# Patient Record
Sex: Male | Born: 1950 | Race: Black or African American | Hispanic: No | Marital: Married | Smoking: Never smoker
Health system: Southern US, Community
[De-identification: ages and names within clinical notes are randomized; demographics above are authoritative.]

## PROBLEM LIST (undated history)

## (undated) DIAGNOSIS — E119 Type 2 diabetes mellitus without complications: Secondary | ICD-10-CM

## (undated) DIAGNOSIS — I10 Essential (primary) hypertension: Secondary | ICD-10-CM

## (undated) HISTORY — PX: COLONOSCOPY: SHX174

---

## 2006-10-22 ENCOUNTER — Ambulatory Visit: Payer: Self-pay | Admitting: Gastroenterology

## 2013-07-05 ENCOUNTER — Ambulatory Visit: Payer: Self-pay | Admitting: Gastroenterology

## 2016-12-18 ENCOUNTER — Emergency Department: Payer: Medicare Other

## 2016-12-18 ENCOUNTER — Encounter: Payer: Self-pay | Admitting: *Deleted

## 2016-12-18 ENCOUNTER — Emergency Department
Admission: EM | Admit: 2016-12-18 | Discharge: 2016-12-18 | Disposition: A | Discharge status: Home / Self Care | Payer: Medicare Other | Attending: Emergency Medicine | Admitting: Emergency Medicine

## 2016-12-18 DIAGNOSIS — R197 Diarrhea, unspecified: Secondary | ICD-10-CM | POA: Diagnosis not present

## 2016-12-18 DIAGNOSIS — R1013 Epigastric pain: Secondary | ICD-10-CM | POA: Diagnosis present

## 2016-12-18 DIAGNOSIS — Z79899 Other long term (current) drug therapy: Secondary | ICD-10-CM | POA: Diagnosis not present

## 2016-12-18 DIAGNOSIS — E119 Type 2 diabetes mellitus without complications: Secondary | ICD-10-CM | POA: Insufficient documentation

## 2016-12-18 DIAGNOSIS — Z7984 Long term (current) use of oral hypoglycemic drugs: Secondary | ICD-10-CM | POA: Insufficient documentation

## 2016-12-18 DIAGNOSIS — I10 Essential (primary) hypertension: Secondary | ICD-10-CM | POA: Diagnosis not present

## 2016-12-18 DIAGNOSIS — R1011 Right upper quadrant pain: Secondary | ICD-10-CM | POA: Insufficient documentation

## 2016-12-18 DIAGNOSIS — R112 Nausea with vomiting, unspecified: Secondary | ICD-10-CM | POA: Insufficient documentation

## 2016-12-18 HISTORY — DX: Essential (primary) hypertension: I10

## 2016-12-18 HISTORY — DX: Type 2 diabetes mellitus without complications: E11.9

## 2016-12-18 LAB — URINALYSIS, COMPLETE (UACMP) WITH MICROSCOPIC
BACTERIA UA: NONE SEEN
BILIRUBIN URINE: NEGATIVE
Glucose, UA: NEGATIVE mg/dL
Ketones, ur: 5 mg/dL — AB
LEUKOCYTES UA: NEGATIVE
Nitrite: NEGATIVE
Protein, ur: NEGATIVE mg/dL
SPECIFIC GRAVITY, URINE: 1.01 (ref 1.005–1.030)
pH: 5 (ref 5.0–8.0)

## 2016-12-18 LAB — COMPREHENSIVE METABOLIC PANEL
ALBUMIN: 4.3 g/dL (ref 3.5–5.0)
ALT: 15 U/L — ABNORMAL LOW (ref 17–63)
AST: 21 U/L (ref 15–41)
Alkaline Phosphatase: 72 U/L (ref 38–126)
Anion gap: 11 (ref 5–15)
BUN: 18 mg/dL (ref 6–20)
CHLORIDE: 98 mmol/L — AB (ref 101–111)
CO2: 29 mmol/L (ref 22–32)
Calcium: 9.6 mg/dL (ref 8.9–10.3)
Creatinine, Ser: 1.49 mg/dL — ABNORMAL HIGH (ref 0.61–1.24)
GFR calc Af Amer: 54 mL/min — ABNORMAL LOW (ref >60)
GFR, EST NON AFRICAN AMERICAN: 47 mL/min — AB (ref >60)
Glucose, Bld: 117 mg/dL — ABNORMAL HIGH (ref 65–99)
Potassium: 3.3 mmol/L — ABNORMAL LOW (ref 3.5–5.1)
Sodium: 138 mmol/L (ref 135–145)
Total Bilirubin: 0.7 mg/dL (ref 0.3–1.2)
Total Protein: 8 g/dL (ref 6.5–8.1)

## 2016-12-18 LAB — CBC
HEMATOCRIT: 39.9 % — AB (ref 40.0–52.0)
Hemoglobin: 13.2 g/dL (ref 13.0–18.0)
MCH: 26.1 pg (ref 26.0–34.0)
MCHC: 32.9 g/dL (ref 32.0–36.0)
MCV: 79.1 fL — AB (ref 80.0–100.0)
Platelets: 299 10*3/uL (ref 150–440)
RBC: 5.05 MIL/uL (ref 4.40–5.90)
RDW: 13.8 % (ref 11.5–14.5)
WBC: 7.6 10*3/uL (ref 3.8–10.6)

## 2016-12-18 LAB — TROPONIN I

## 2016-12-18 LAB — LIPASE, BLOOD: Lipase: 17 U/L (ref 11–51)

## 2016-12-18 MED ORDER — POTASSIUM CHLORIDE CRYS ER 20 MEQ PO TBCR
40.0000 meq | EXTENDED_RELEASE_TABLET | Freq: Once | ORAL | Status: AC
  Administered 2016-12-18: 40 meq via ORAL
  Filled 2016-12-18: qty 2

## 2016-12-18 MED ORDER — SODIUM CHLORIDE 0.9 % IV BOLUS (SEPSIS)
1000.0000 mL | Freq: Once | INTRAVENOUS | Status: AC
  Administered 2016-12-18: 1000 mL via INTRAVENOUS

## 2016-12-18 MED ORDER — ONDANSETRON HCL 4 MG/2ML IJ SOLN
4.0000 mg | Freq: Once | INTRAMUSCULAR | Status: AC
  Administered 2016-12-18: 4 mg via INTRAVENOUS
  Filled 2016-12-18: qty 2

## 2016-12-18 MED ORDER — GI COCKTAIL ~~LOC~~
30.0000 mL | Freq: Once | ORAL | Status: AC
Start: 2016-12-18 — End: 2016-12-18
  Administered 2016-12-18: 30 mL via ORAL
  Filled 2016-12-18: qty 30

## 2016-12-18 MED ORDER — ONDANSETRON 4 MG PO TBDP: ORAL_TABLET | ORAL | 0 refills | Status: DC

## 2016-12-18 MED ORDER — POTASSIUM CHLORIDE CRYS ER 20 MEQ PO TBCR
EXTENDED_RELEASE_TABLET | ORAL | Status: AC
  Filled 2016-12-18: qty 1

## 2016-12-18 NOTE — ED Provider Notes (Signed)
[facility]-EMERGENCY DEPARTMENT Provider Note   CSN: [id number] Arrival date & time: 12/18/16  1717     History   Chief Complaint Chief Complaint  Patient presents with  . Abdominal Pain  . Emesis    HPI Marc Daniel is a 66 y.o. male hx of DM, Hypertension here presenting with epigastric pain, vomiting. Patient states that he has been having epigastric pain and right upper quadrant pain after he eats for the last 3 days. States that he occasionally has some vomiting after he eats. He also has some loose stools of diarrhea as well. Denies any fevers or chills. Patient has no previous abdominal surgeries and has no chest pain.  The history is provided by the patient.    Past Medical History:  Diagnosis Date  . Diabetes mellitus without complication (HCC)   . Hypertension     There are no active problems to display for this patient.   No past surgical history on file.     Home Medications    Prior to Admission medications   Medication Sig Start Date End Date Taking? Authorizing Provider  glipiZIDE (GLUCOTROL) 5 MG tablet Take 5 mg by mouth 2 (two) times daily. 10/22/16  Yes Historical Provider, MD  lisinopril-hydrochlorothiazide (PRINZIDE,ZESTORETIC) 20-25 MG tablet Take 1 tablet by mouth daily. 11/18/16  Yes Historical Provider, MD  lovastatin (MEVACOR) 20 MG tablet Take 20 mg by mouth daily. 11/18/16  Yes Historical Provider, MD  metFORMIN (GLUCOPHAGE) 500 MG tablet Take 1,000 mg by mouth 2 (two) times daily. 10/18/16  Yes Historical Provider, MD  triamcinolone cream (KENALOG) 0.1 % Apply 1 application topically 2 (two) times daily. 11/26/16  Yes Historical Provider, MD    Family History No family history on file.  Social History Social History  Substance Use Topics  . Smoking status: Never Smoker  . Smokeless tobacco: Not on file  . Alcohol use No     Allergies   Sulfa antibiotics   Review of Systems Review of Systems  Gastrointestinal: Positive for  abdominal pain and vomiting.  All other systems reviewed and are negative.    Physical Exam Updated Vital Signs BP 134/86   Pulse 77   Temp 98.7 F (37.1 C) (Oral)   Resp 18   Ht  (1.651 m)   Wt 159 lb (72.1 kg)   SpO2 97%   BMI 26.46 kg/m   Physical Exam  Constitutional: He is oriented to person, place, and time. He appears well-nourished.  Slightly dehydrated   HENT:  Head: Normocephalic.  MM slightly dry   Eyes: EOM are normal. Pupils are equal, round, and reactive to light.  Neck: Normal range of motion. Neck supple.  Cardiovascular: Normal rate, regular rhythm and normal heart sounds.   Pulmonary/Chest: Effort normal and breath sounds normal. No respiratory distress. He has no wheezes. He has no rales.  Abdominal: Soft. Bowel sounds are normal.  Mild epigastric and RUQ tenderness, no murphy. No CVAT. No lower abdominal tenderness   Musculoskeletal: Normal range of motion.  Neurological: He is alert and oriented to person, place, and time. No cranial nerve deficit.  Skin: Skin is warm.  Psychiatric: He has a normal mood and affect.  Nursing note and vitals reviewed.    ED Treatments / Results  Labs (all labs ordered are listed, but only abnormal results are displayed) Labs Reviewed  COMPREHENSIVE METABOLIC PANEL - Abnormal; Notable for the following:       Result Value   Potassium 3.3 (*)  Chloride 98 (*)    Glucose, Bld 117 (*)    Creatinine, Ser 1.49 (*)    ALT 15 (*)    GFR calc non Af Amer 47 (*)    GFR calc Af Amer 54 (*)    All other components within normal limits  CBC - Abnormal; Notable for the following:    HCT 39.9 (*)    MCV 79.1 (*)    All other components within normal limits  URINALYSIS, COMPLETE (UACMP) WITH MICROSCOPIC - Abnormal; Notable for the following:    Color, Urine YELLOW (*)    APPearance CLEAR (*)    Hgb urine dipstick SMALL (*)    Ketones, ur 5 (*)    Squamous Epithelial / LPF 0-5 (*)    All other components within  normal limits  LIPASE, BLOOD  TROPONIN I    EKG  EKG Interpretation  Date/Time:  Wednesday December 18 2016 17:39:51 EDT Ventricular Rate:  96 PR Interval:  128 QRS Duration: 74 QT Interval:  338 QTC Calculation: 427 R Axis:   164 Text Interpretation:  Sinus rhythm with marked sinus arrhythmia Lateral infarct , age undetermined T wave abnormality, consider inferior ischemia Abnormal ECG No previous ECGs available Reconfirmed by YAO  MD, DAVID ([id number]) on 12/18/2016 11:27:39 PM       Radiology US Abdomen Limited Ruq  Result Date: 12/18/2016 CLINICAL DATA:  Right upper quadrant pain for 2 days EXAM: US ABDOMEN LIMITED - RIGHT UPPER QUADRANT COMPARISON:  None. FINDINGS: Gallbladder: No gallstones or wall thickening visualized. No sonographic Murphy sign noted by sonographer. Common bile duct: Diameter: 4 mm. Liver: No focal lesion identified. Within normal limits in parenchymal echogenicity. Incidental note is made of multiple right renal cysts. The largest of these measures 3.7 cm. IMPRESSION: Right renal cyst.  No other focal abnormality is noted. Electronically Signed   By: Alcide Clever M.D.   On: 12/18/2016 21:21    Procedures Procedures (including critical care time)  Medications Ordered in ED Medications  sodium chloride 0.9 % bolus 1,000 mL (1,000 mLs Intravenous New Bag/Given 12/18/16 2035)  ondansetron [facility]) injection 4 mg (4 mg Intravenous Given 12/18/16 2035)  potassium chloride SA (K-DUR,KLOR-CON) CR tablet 40 mEq (40 mEq Oral Given 12/18/16 2203)  gi cocktail (Maalox,Lidocaine,Donnatal) (30 mLs Oral Given 12/18/16 2154)     Initial Impression / Assessment and Plan / ED Course  I have reviewed the triage vital signs and the nursing notes.  Pertinent labs & imaging results that were available during my care of the patient were reviewed by me and considered in my medical decision making (see chart for details).     Marc Daniel is a 67 y.o. male here with epigastric  pain, vomiting, diarrhea. Likely gastro. Consider cholelithiasis vs acute chole as well. Will get labs, lipase, RUQ [place]. Will hydrate and reassess.   11:27 PM UA unremarkable. Labs showed K 3.3, supplemented. [place] RUQ unremarkable. Tolerated PO in the ED. Will dc home with zofran prn. Likely viral gastro.   Final Clinical Impressions(s) / ED Diagnoses   Final diagnoses:  RUQ pain    New Prescriptions New Prescriptions   No medications on file     Charlynne Pander, MD 12/18/16 2327

## 2016-12-18 NOTE — ED Triage Notes (Signed)
Pt reports vomiting after eating for the last two days with abdominal pain

## 2016-12-18 NOTE — Discharge Instructions (Signed)
Stay hydrated.   Take zofran as needed for nausea.   See your doctor  Return to ER if you have worse vomiting, severe abdominal pain, dehydration, fever.

## 2016-12-18 NOTE — ED Notes (Signed)
Specimen cup was given to collect when able.

## 2017-09-24 ENCOUNTER — Encounter: Payer: Self-pay | Admitting: *Deleted

## 2017-09-24 ENCOUNTER — Other Ambulatory Visit: Payer: Self-pay

## 2017-09-26 NOTE — Discharge Instructions (Signed)

## 2017-09-29 ENCOUNTER — Encounter
Admission: RE | Disposition: A | Discharge status: Home / Self Care | Payer: Self-pay | Source: Ambulatory Visit | Attending: Ophthalmology

## 2017-09-29 ENCOUNTER — Ambulatory Visit
Admission: RE | Admit: 2017-09-29 | Discharge: 2017-09-29 | Disposition: A | Discharge status: Home / Self Care | Payer: BLUE CROSS/BLUE SHIELD | Source: Ambulatory Visit | Attending: Ophthalmology | Admitting: Ophthalmology

## 2017-09-29 ENCOUNTER — Ambulatory Visit: Payer: BLUE CROSS/BLUE SHIELD | Admitting: Anesthesiology

## 2017-09-29 DIAGNOSIS — Z79899 Other long term (current) drug therapy: Secondary | ICD-10-CM | POA: Diagnosis not present

## 2017-09-29 DIAGNOSIS — I10 Essential (primary) hypertension: Secondary | ICD-10-CM | POA: Insufficient documentation

## 2017-09-29 DIAGNOSIS — E119 Type 2 diabetes mellitus without complications: Secondary | ICD-10-CM | POA: Diagnosis not present

## 2017-09-29 DIAGNOSIS — E7800 Pure hypercholesterolemia, unspecified: Secondary | ICD-10-CM | POA: Diagnosis not present

## 2017-09-29 DIAGNOSIS — H2511 Age-related nuclear cataract, right eye: Secondary | ICD-10-CM | POA: Insufficient documentation

## 2017-09-29 DIAGNOSIS — Z7984 Long term (current) use of oral hypoglycemic drugs: Secondary | ICD-10-CM | POA: Insufficient documentation

## 2017-09-29 HISTORY — PX: CATARACT EXTRACTION W/PHACO: SHX586

## 2017-09-29 LAB — GLUCOSE, CAPILLARY
GLUCOSE-CAPILLARY: 114 mg/dL — AB (ref 65–99)
Glucose-Capillary: 137 mg/dL — ABNORMAL HIGH (ref 65–99)

## 2017-09-29 SURGERY — PHACOEMULSIFICATION, CATARACT, WITH IOL INSERTION
Anesthesia: Monitor Anesthesia Care | Site: Eye | Laterality: Right | Wound class: Clean

## 2017-09-29 MED ORDER — MIDAZOLAM HCL 2 MG/2ML IJ SOLN
INTRAMUSCULAR | Status: DC | PRN
  Administered 2017-09-29: 2 mg via INTRAVENOUS

## 2017-09-29 MED ORDER — NA HYALUR & NA CHOND-NA HYALUR 0.4-0.35 ML IO KIT
PACK | INTRAOCULAR | Status: DC | PRN
  Administered 2017-09-29: 1 mL via INTRAOCULAR

## 2017-09-29 MED ORDER — FENTANYL CITRATE (PF) 100 MCG/2ML IJ SOLN
INTRAMUSCULAR | Status: DC | PRN
  Administered 2017-09-29: 50 ug via INTRAVENOUS

## 2017-09-29 MED ORDER — EPINEPHRINE PF 1 MG/ML IJ SOLN
INTRAOCULAR | Status: DC | PRN
  Administered 2017-09-29: 64 mL via OPHTHALMIC

## 2017-09-29 MED ORDER — BRIMONIDINE TARTRATE-TIMOLOL 0.2-0.5 % OP SOLN
OPHTHALMIC | Status: DC | PRN
  Administered 2017-09-29: 1 [drp] via OPHTHALMIC

## 2017-09-29 MED ORDER — ARMC OPHTHALMIC DILATING DROPS
1.0000 "application " | OPHTHALMIC | Status: DC | PRN
  Administered 2017-09-29 (×3): 1 via OPHTHALMIC

## 2017-09-29 MED ORDER — CEFUROXIME OPHTHALMIC INJECTION 1 MG/0.1 ML
INJECTION | OPHTHALMIC | Status: DC | PRN
  Administered 2017-09-29: 0.1 mL via INTRACAMERAL

## 2017-09-29 MED ORDER — MOXIFLOXACIN HCL 0.5 % OP SOLN
1.0000 [drp] | OPHTHALMIC | Status: DC | PRN
  Administered 2017-09-29 (×3): 1 [drp] via OPHTHALMIC

## 2017-09-29 MED ORDER — BALANCED SALT IO SOLN
INTRAOCULAR | Status: DC | PRN
  Administered 2017-09-29: 1 mL

## 2017-09-29 SURGICAL SUPPLY — 18 items
CANNULA ANT/CHMB 27GA (MISCELLANEOUS) ×3 IMPLANT
GLOVE SURG LX 7.5 STRW (GLOVE) ×2
GLOVE SURG LX STRL 7.5 STRW (GLOVE) ×1 IMPLANT
GLOVE SURG TRIUMPH 8.0 PF LTX (GLOVE) ×3 IMPLANT
GOWN STRL REUS W/ TWL LRG LVL3 (GOWN DISPOSABLE) ×2 IMPLANT
GOWN STRL REUS W/TWL LRG LVL3 (GOWN DISPOSABLE) ×4
LENS IOL TECNIS ITEC 20.0 (Intraocular Lens) ×3 IMPLANT
MARKER SKIN DUAL TIP RULER LAB (MISCELLANEOUS) ×3 IMPLANT
NEEDLE FILTER BLUNT 18X 1/2SAF (NEEDLE) ×2
NEEDLE FILTER BLUNT 18X1 1/2 (NEEDLE) ×1 IMPLANT
PACK CATARACT BRASINGTON (MISCELLANEOUS) ×3 IMPLANT
PACK EYE AFTER SURG (MISCELLANEOUS) ×3 IMPLANT
PACK OPTHALMIC (MISCELLANEOUS) ×3 IMPLANT
SYR 3ML LL SCALE MARK (SYRINGE) ×3 IMPLANT
SYR 5ML LL (SYRINGE) ×3 IMPLANT
SYR TB 1ML LUER SLIP (SYRINGE) ×3 IMPLANT
WATER STERILE IRR 500ML POUR (IV SOLUTION) ×3 IMPLANT
WIPE NON LINTING 3.25X3.25 (MISCELLANEOUS) ×3 IMPLANT

## 2017-09-29 NOTE — Op Note (Signed)
LOCATION:  [facility]   PREOPERATIVE DIAGNOSIS:    Nuclear sclerotic cataract right eye. H25.11   POSTOPERATIVE DIAGNOSIS:  Nuclear sclerotic cataract right eye.     PROCEDURE:  Phacoemusification with posterior chamber intraocular lens placement of the right eye   LENS:   Implant Name Type Inv. Item Serial No. Manufacturer Lot No. LRB No. Used  LENS IOL DIOP 20.0 - [id number] Intraocular Lens LENS IOL DIOP 20.0 [id number] AMO  Right 1        ULTRASOUND TIME: 12 % of 1 minutes, 16 seconds.  CDE 8.9   SURGEON:  Deirdre Evenerhadwick R. Leslie Langille, MD   ANESTHESIA:  Topical with tetracaine drops and 2% Xylocaine jelly, augmented with 1% preservative-free intracameral lidocaine.    COMPLICATIONS:  None.   DESCRIPTION OF PROCEDURE:  The patient was identified in the holding room and transported to the operating room and placed in the supine position under the operating microscope.  The right eye was identified as the operative eye and it was prepped and draped in the usual sterile ophthalmic fashion.   A 1 millimeter clear-corneal paracentesis was made at the 12:00 position.  0.5 ml of preservative-free 1% lidocaine was injected into the anterior chamber. The anterior chamber was filled with Viscoat viscoelastic.  A 2.4 millimeter keratome was used to make a near-clear corneal incision at the 9:00 position.  A curvilinear capsulorrhexis was made with a cystotome and capsulorrhexis forceps.  Balanced salt solution was used to hydrodissect and hydrodelineate the nucleus.   Phacoemulsification was then used in stop and chop fashion to remove the lens nucleus and epinucleus.  The remaining cortex was then removed using the irrigation and aspiration handpiece. Provisc was then placed into the capsular bag to distend it for lens placement.  A lens was then injected into the capsular bag.  The remaining viscoelastic was aspirated.   Wounds were hydrated with balanced salt solution.  The anterior  chamber was inflated to a physiologic pressure with balanced salt solution.  No wound leaks were noted. Cefuroxime 0.1 ml of a 10mg /ml solution was injected into the anterior chamber for a dose of 1 mg of intracameral antibiotic at the completion of the case.   Timolol and Brimonidine drops were applied to the eye.  The patient was taken to the recovery room in stable condition without complications of anesthesia or surgery.   Thandiwe Siragusa 09/29/2017, 8:07 AM

## 2017-09-29 NOTE — Transfer of Care (Signed)
Immediate Anesthesia Transfer of Care Note  Patient: Marc Daniel  Procedure(s) Performed: CATARACT EXTRACTION PHACO AND INTRAOCULAR LENS PLACEMENT (IOC) RIGHT DIABETIC (Right Eye)  Patient Location: PACU  Anesthesia Type: MAC  Level of Consciousness: awake, alert  and patient cooperative  Airway and Oxygen Therapy: Patient Spontanous Breathing and Patient connected to supplemental oxygen  Post-op Assessment: Post-op Vital signs reviewed, Patient's Cardiovascular Status Stable, Respiratory Function Stable, Patent Airway and No signs of Nausea or vomiting  Post-op Vital Signs: Reviewed and stable  Complications: No apparent anesthesia complications

## 2017-09-29 NOTE — Anesthesia Postprocedure Evaluation (Signed)
Anesthesia Post Note  Patient: Marc Daniel  Procedure(s) Performed: CATARACT EXTRACTION PHACO AND INTRAOCULAR LENS PLACEMENT (IOC) RIGHT DIABETIC (Right Eye)  Patient location during evaluation: PACU Anesthesia Type: MAC Level of consciousness: awake Pain management: pain level controlled Vital Signs Assessment: post-procedure vital signs reviewed and stable Respiratory status: spontaneous breathing Cardiovascular status: blood pressure returned to baseline Postop Assessment: no headache Anesthetic complications: no    Lavonna Monarch

## 2017-09-29 NOTE — Anesthesia Preprocedure Evaluation (Addendum)
Anesthesia Evaluation  Patient identified by MRN, date of birth, ID band Patient awake    Reviewed: Allergy & Precautions, NPO status , Patient's Chart, lab work & pertinent test results, reviewed documented beta blocker date and time   Airway Mallampati: II  TM Distance: >3 FB Neck ROM: Full    Dental no notable dental hx.    Pulmonary neg pulmonary ROS,    Pulmonary exam normal breath sounds clear to auscultation- rhonchi       Cardiovascular hypertension, Normal cardiovascular exam Rhythm:Regular     Neuro/Psych negative neurological ROS  negative psych ROS   GI/Hepatic negative GI ROS, Neg liver ROS,   Endo/Other  diabetes  Renal/GU negative Renal ROS  negative genitourinary   Musculoskeletal negative musculoskeletal ROS (+)   Abdominal Normal abdominal exam  (+)   Peds  Hematology   Anesthesia Other Findings   Reproductive/Obstetrics                            Anesthesia Physical Anesthesia Plan  ASA: II  Anesthesia Plan: MAC   Post-op Pain Management:    Induction: Intravenous  PONV Risk Score and Plan:   Airway Management Planned: Natural Airway  Additional Equipment: None  Intra-op Plan:   Post-operative Plan:   Informed Consent: I have reviewed the patients History and Physical, chart, labs and discussed the procedure including the risks, benefits and alternatives for the proposed anesthesia with the patient or authorized representative who has indicated his/her understanding and acceptance.     Plan Discussed with: CRNA, Anesthesiologist and Surgeon  Anesthesia Plan Comments:         Anesthesia Quick Evaluation

## 2017-09-29 NOTE — Anesthesia Procedure Notes (Signed)
Procedure Name: MAC Date/Time: 09/29/2017 7:50 AM Performed by: Janna Arch, CRNA Pre-anesthesia Checklist: Patient identified, Emergency Drugs available, Suction available and Patient being monitored Patient Re-evaluated:Patient Re-evaluated prior to induction Oxygen Delivery Method: Nasal cannula

## 2017-09-29 NOTE — H&P (Signed)
The History and Physical notes are on paper, have been signed, and are to be scanned. The patient remains stable and unchanged from the H&P.   Previous H&P reviewed, patient examined, and there are no changes.  Tara Rud 09/29/2017 7:41 AM

## 2017-10-16 ENCOUNTER — Other Ambulatory Visit: Payer: Self-pay

## 2017-10-16 ENCOUNTER — Encounter: Payer: Self-pay | Admitting: *Deleted

## 2017-10-20 NOTE — Discharge Instructions (Signed)

## 2017-10-22 ENCOUNTER — Encounter
Admission: RE | Disposition: A | Discharge status: Home / Self Care | Payer: Self-pay | Source: Ambulatory Visit | Attending: Ophthalmology

## 2017-10-22 ENCOUNTER — Ambulatory Visit: Payer: BLUE CROSS/BLUE SHIELD | Admitting: Anesthesiology

## 2017-10-22 ENCOUNTER — Ambulatory Visit
Admission: RE | Admit: 2017-10-22 | Discharge: 2017-10-22 | Disposition: A | Discharge status: Home / Self Care | Payer: BLUE CROSS/BLUE SHIELD | Source: Ambulatory Visit | Attending: Ophthalmology | Admitting: Ophthalmology

## 2017-10-22 DIAGNOSIS — E119 Type 2 diabetes mellitus without complications: Secondary | ICD-10-CM | POA: Diagnosis not present

## 2017-10-22 DIAGNOSIS — H2512 Age-related nuclear cataract, left eye: Secondary | ICD-10-CM | POA: Insufficient documentation

## 2017-10-22 DIAGNOSIS — I10 Essential (primary) hypertension: Secondary | ICD-10-CM | POA: Diagnosis not present

## 2017-10-22 DIAGNOSIS — Z7984 Long term (current) use of oral hypoglycemic drugs: Secondary | ICD-10-CM | POA: Insufficient documentation

## 2017-10-22 DIAGNOSIS — Z79899 Other long term (current) drug therapy: Secondary | ICD-10-CM | POA: Insufficient documentation

## 2017-10-22 DIAGNOSIS — E7800 Pure hypercholesterolemia, unspecified: Secondary | ICD-10-CM | POA: Insufficient documentation

## 2017-10-22 HISTORY — PX: CATARACT EXTRACTION W/PHACO: SHX586

## 2017-10-22 LAB — GLUCOSE, CAPILLARY: Glucose-Capillary: 143 mg/dL — ABNORMAL HIGH (ref 65–99)

## 2017-10-22 SURGERY — PHACOEMULSIFICATION, CATARACT, WITH IOL INSERTION
Anesthesia: Monitor Anesthesia Care | Site: Eye | Laterality: Left | Wound class: Clean

## 2017-10-22 MED ORDER — LACTATED RINGERS IV SOLN: INTRAVENOUS | Status: DC

## 2017-10-22 MED ORDER — EPINEPHRINE PF 1 MG/ML IJ SOLN
INTRAOCULAR | Status: DC | PRN
  Administered 2017-10-22: 57 mL via OPHTHALMIC

## 2017-10-22 MED ORDER — ONDANSETRON HCL 4 MG/2ML IJ SOLN: 4.0000 mg | Freq: Once | INTRAMUSCULAR | Status: DC | PRN

## 2017-10-22 MED ORDER — ACETAMINOPHEN 160 MG/5ML PO SOLN: 325.0000 mg | ORAL | Status: DC | PRN

## 2017-10-22 MED ORDER — BRIMONIDINE TARTRATE-TIMOLOL 0.2-0.5 % OP SOLN
OPHTHALMIC | Status: DC | PRN
  Administered 2017-10-22: 1 [drp] via OPHTHALMIC

## 2017-10-22 MED ORDER — ARMC OPHTHALMIC DILATING DROPS
1.0000 "application " | OPHTHALMIC | Status: DC | PRN
  Administered 2017-10-22 (×3): 1 via OPHTHALMIC

## 2017-10-22 MED ORDER — MIDAZOLAM HCL 2 MG/2ML IJ SOLN
INTRAMUSCULAR | Status: DC | PRN
  Administered 2017-10-22: 1 mg via INTRAVENOUS

## 2017-10-22 MED ORDER — LIDOCAINE HCL (PF) 2 % IJ SOLN
INTRAOCULAR | Status: DC | PRN
  Administered 2017-10-22: 1 mL via INTRAMUSCULAR

## 2017-10-22 MED ORDER — NA HYALUR & NA CHOND-NA HYALUR 0.4-0.35 ML IO KIT
PACK | INTRAOCULAR | Status: DC | PRN
  Administered 2017-10-22: 1 mL via INTRAOCULAR

## 2017-10-22 MED ORDER — CEFUROXIME OPHTHALMIC INJECTION 1 MG/0.1 ML
INJECTION | OPHTHALMIC | Status: DC | PRN
  Administered 2017-10-22: .3 mL via OPHTHALMIC

## 2017-10-22 MED ORDER — MOXIFLOXACIN HCL 0.5 % OP SOLN
1.0000 [drp] | OPHTHALMIC | Status: DC | PRN
  Administered 2017-10-22 (×3): 1 [drp] via OPHTHALMIC

## 2017-10-22 MED ORDER — FENTANYL CITRATE (PF) 100 MCG/2ML IJ SOLN
INTRAMUSCULAR | Status: DC | PRN
  Administered 2017-10-22: 50 ug via INTRAVENOUS

## 2017-10-22 MED ORDER — ACETAMINOPHEN 325 MG PO TABS: 650.0000 mg | ORAL_TABLET | Freq: Once | ORAL | Status: DC | PRN

## 2017-10-22 SURGICAL SUPPLY — 25 items
CANNULA ANT/CHMB 27GA (MISCELLANEOUS) ×3 IMPLANT
CARTRIDGE ABBOTT (MISCELLANEOUS) IMPLANT
GLOVE SURG LX 7.5 STRW (GLOVE) ×2
GLOVE SURG LX STRL 7.5 STRW (GLOVE) ×1 IMPLANT
GLOVE SURG TRIUMPH 8.0 PF LTX (GLOVE) ×3 IMPLANT
GOWN STRL REUS W/ TWL LRG LVL3 (GOWN DISPOSABLE) ×2 IMPLANT
GOWN STRL REUS W/TWL LRG LVL3 (GOWN DISPOSABLE) ×4
LENS IOL TECNIS ITEC 20.5 (Intraocular Lens) ×3 IMPLANT
MARKER SKIN DUAL TIP RULER LAB (MISCELLANEOUS) ×3 IMPLANT
NDL RETROBULBAR .5 NSTRL (NEEDLE) IMPLANT
NEEDLE FILTER BLUNT 18X 1/2SAF (NEEDLE) ×2
NEEDLE FILTER BLUNT 18X1 1/2 (NEEDLE) ×1 IMPLANT
PACK CATARACT BRASINGTON (MISCELLANEOUS) ×3 IMPLANT
PACK EYE AFTER SURG (MISCELLANEOUS) ×3 IMPLANT
PACK OPTHALMIC (MISCELLANEOUS) ×3 IMPLANT
RING MALYGIN 7.0 (MISCELLANEOUS) IMPLANT
SUT ETHILON 10-0 CS-B-6CS-B-6 (SUTURE)
SUT VICRYL  9 0 (SUTURE)
SUT VICRYL 9 0 (SUTURE) IMPLANT
SUTURE EHLN 10-0 CS-B-6CS-B-6 (SUTURE) IMPLANT
SYR 3ML LL SCALE MARK (SYRINGE) ×3 IMPLANT
SYR 5ML LL (SYRINGE) ×3 IMPLANT
SYR TB 1ML LUER SLIP (SYRINGE) ×3 IMPLANT
WATER STERILE IRR 500ML POUR (IV SOLUTION) ×3 IMPLANT
WIPE NON LINTING 3.25X3.25 (MISCELLANEOUS) ×3 IMPLANT

## 2017-10-22 NOTE — Transfer of Care (Signed)
Immediate Anesthesia Transfer of Care Note  Patient: Marc Daniel  Procedure(s) Performed: CATARACT EXTRACTION PHACO AND INTRAOCULAR LENS PLACEMENT (IOC) LEFT DIABETIC (Left Eye)  Patient Location: PACU  Anesthesia Type: MAC  Level of Consciousness: awake, alert  and patient cooperative  Airway and Oxygen Therapy: Patient Spontanous Breathing and Patient connected to supplemental oxygen  Post-op Assessment: Post-op Vital signs reviewed, Patient's Cardiovascular Status Stable, Respiratory Function Stable, Patent Airway and No signs of Nausea or vomiting  Post-op Vital Signs: Reviewed and stable  Complications: No apparent anesthesia complications

## 2017-10-22 NOTE — H&P (Signed)
The History and Physical notes are on paper, have been signed, and are to be scanned. The patient remains stable and unchanged from the H&P.   Previous H&P reviewed, patient examined, and there are no changes.  Marc Daniel 10/22/2017 9:03 AM   

## 2017-10-22 NOTE — Anesthesia Postprocedure Evaluation (Signed)
Anesthesia Post Note  Patient: Marc Daniel  Procedure(s) Performed: CATARACT EXTRACTION PHACO AND INTRAOCULAR LENS PLACEMENT (IOC) LEFT DIABETIC (Left Eye)  Patient location during evaluation: PACU Anesthesia Type: MAC Level of consciousness: awake and alert, oriented and patient cooperative Pain management: pain level controlled Vital Signs Assessment: post-procedure vital signs reviewed and stable Respiratory status: spontaneous breathing, nonlabored ventilation and respiratory function stable Cardiovascular status: blood pressure returned to baseline and stable Postop Assessment: adequate PO intake Anesthetic complications: no    Darrin Nipper

## 2017-10-22 NOTE — Anesthesia Procedure Notes (Signed)
Procedure Name: MAC Performed by: Cheryn Lundquist, CRNA Pre-anesthesia Checklist: Patient identified, Emergency Drugs available, Suction available, Patient being monitored and Timeout performed Patient Re-evaluated:Patient Re-evaluated prior to induction Oxygen Delivery Method: Nasal cannula       

## 2017-10-22 NOTE — Anesthesia Preprocedure Evaluation (Signed)
Anesthesia Evaluation  Patient identified by MRN, date of birth, ID band Patient awake    Reviewed: Allergy & Precautions, NPO status , Patient's Chart, lab work & pertinent test results  History of Anesthesia Complications Negative for: history of anesthetic complications  Airway Mallampati: II  TM Distance: >3 FB Neck ROM: Full    Dental no notable dental hx.    Pulmonary neg pulmonary ROS,    Pulmonary exam normal breath sounds clear to auscultation       Cardiovascular Exercise Tolerance: Good hypertension, Normal cardiovascular exam Rhythm:Regular Rate:Normal     Neuro/Psych negative neurological ROS     GI/Hepatic negative GI ROS,   Endo/Other  diabetes, Type 2  Renal/GU negative Renal ROS     Musculoskeletal   Abdominal   Peds  Hematology negative hematology ROS (+)   Anesthesia Other Findings   Reproductive/Obstetrics                             Anesthesia Physical Anesthesia Plan  ASA: II  Anesthesia Plan: MAC   Post-op Pain Management:    Induction: Intravenous  PONV Risk Score and Plan: 1 and TIVA and Midazolam  Airway Management Planned: Natural Airway  Additional Equipment:   Intra-op Plan:   Post-operative Plan:   Informed Consent: I have reviewed the patients History and Physical, chart, labs and discussed the procedure including the risks, benefits and alternatives for the proposed anesthesia with the patient or authorized representative who has indicated his/her understanding and acceptance.     Plan Discussed with: CRNA  Anesthesia Plan Comments:         Anesthesia Quick Evaluation

## 2017-10-22 NOTE — Op Note (Signed)
OPERATIVE NOTE  Michelle NasutiDonnell M Nephew [id number] 10/22/2017   PREOPERATIVE DIAGNOSIS:  Nuclear sclerotic cataract left eye. H25.12   POSTOPERATIVE DIAGNOSIS:    Nuclear sclerotic cataract left eye.     PROCEDURE:  Phacoemusification with posterior chamber intraocular lens placement of the left eye   LENS:   Implant Name Type Inv. Item Serial No. Manufacturer Lot No. LRB No. Used  LENS IOL DIOP 20.5 - [id number] Intraocular Lens LENS IOL DIOP 20.5 [id number] AMO  Left 1        ULTRASOUND TIME: 12  % of 1 minutes 7 seconds, CDE 8.1  SURGEON:  Deirdre Evenerhadwick R. Ninnie Fein, MD   ANESTHESIA:  Topical with tetracaine drops and 2% Xylocaine jelly, augmented with 1% preservative-free intracameral lidocaine.    COMPLICATIONS:  None.   DESCRIPTION OF PROCEDURE:  The patient was identified in the holding room and transported to the operating room and placed in the supine position under the operating microscope.  The left eye was identified as the operative eye and it was prepped and draped in the usual sterile ophthalmic fashion.   A 1 millimeter clear-corneal paracentesis was made at the 1:30 position.  0.5 ml of preservative-free 1% lidocaine was injected into the anterior chamber.  The anterior chamber was filled with Viscoat viscoelastic.  A 2.4 millimeter keratome was used to make a near-clear corneal incision at the 10:30 position.  .  A curvilinear capsulorrhexis was made with a cystotome and capsulorrhexis forceps.  Balanced salt solution was used to hydrodissect and hydrodelineate the nucleus.   Phacoemulsification was then used in stop and chop fashion to remove the lens nucleus and epinucleus.  The remaining cortex was then removed using the irrigation and aspiration handpiece. Provisc was then placed into the capsular bag to distend it for lens placement.  A lens was then injected into the capsular bag.  The remaining viscoelastic was aspirated.   Wounds were hydrated with balanced salt  solution.  The anterior chamber was inflated to a physiologic pressure with balanced salt solution.  No wound leaks were noted. Cefuroxime 0.1 ml of a 10mg /ml solution was injected into the anterior chamber for a dose of 1 mg of intracameral antibiotic at the completion of the case. \  Timolol and Brimonidine drops were applied to the eye.  The patient was taken to the recovery room in stable condition without complications of anesthesia or surgery.  Lark Langenfeld 10/22/2017, 10:20 AM

## 2018-05-07 ENCOUNTER — Other Ambulatory Visit: Payer: Self-pay

## 2018-05-07 ENCOUNTER — Emergency Department
Admission: EM | Admit: 2018-05-07 | Discharge: 2018-05-07 | Disposition: A | Discharge status: Home / Self Care | Payer: BLUE CROSS/BLUE SHIELD | Attending: Emergency Medicine | Admitting: Emergency Medicine

## 2018-05-07 ENCOUNTER — Encounter: Payer: Self-pay | Admitting: Emergency Medicine

## 2018-05-07 DIAGNOSIS — R1110 Vomiting, unspecified: Secondary | ICD-10-CM | POA: Insufficient documentation

## 2018-05-07 DIAGNOSIS — R112 Nausea with vomiting, unspecified: Secondary | ICD-10-CM

## 2018-05-07 LAB — COMPREHENSIVE METABOLIC PANEL
ALT: 17 U/L (ref 0–44)
AST: 18 U/L (ref 15–41)
Albumin: 4.3 g/dL (ref 3.5–5.0)
Alkaline Phosphatase: 58 U/L (ref 38–126)
Anion gap: 8 (ref 5–15)
BUN: 22 mg/dL (ref 8–23)
CO2: 26 mmol/L (ref 22–32)
Calcium: 9.6 mg/dL (ref 8.9–10.3)
Chloride: 106 mmol/L (ref 98–111)
Creatinine, Ser: 1.46 mg/dL — ABNORMAL HIGH (ref 0.61–1.24)
GFR calc Af Amer: 55 mL/min — ABNORMAL LOW (ref >60)
GFR calc non Af Amer: 47 mL/min — ABNORMAL LOW (ref >60)
Glucose, Bld: 124 mg/dL — ABNORMAL HIGH (ref 70–99)
Potassium: 3.4 mmol/L — ABNORMAL LOW (ref 3.5–5.1)
Sodium: 140 mmol/L (ref 135–145)
Total Bilirubin: 0.4 mg/dL (ref 0.3–1.2)
Total Protein: 7.6 g/dL (ref 6.5–8.1)

## 2018-05-07 LAB — URINALYSIS, COMPLETE (UACMP) WITH MICROSCOPIC
Bacteria, UA: NONE SEEN
Bilirubin Urine: NEGATIVE
GLUCOSE, UA: NEGATIVE mg/dL
HGB URINE DIPSTICK: NEGATIVE
Ketones, ur: NEGATIVE mg/dL
LEUKOCYTES UA: NEGATIVE
NITRITE: NEGATIVE
PH: 5 (ref 5.0–8.0)
PROTEIN: NEGATIVE mg/dL
Specific Gravity, Urine: 1.018 (ref 1.005–1.030)

## 2018-05-07 LAB — CBC
HCT: 42 % (ref 40.0–52.0)
Hemoglobin: 13.5 g/dL (ref 13.0–18.0)
MCH: 25.8 pg — ABNORMAL LOW (ref 26.0–34.0)
MCHC: 32.1 g/dL (ref 32.0–36.0)
MCV: 80.6 fL (ref 80.0–100.0)
PLATELETS: 267 10*3/uL (ref 150–440)
RBC: 5.21 MIL/uL (ref 4.40–5.90)
RDW: 13.5 % (ref 11.5–14.5)
WBC: 6.9 10*3/uL (ref 3.8–10.6)

## 2018-05-07 LAB — LIPASE, BLOOD: Lipase: 50 U/L (ref 11–51)

## 2018-05-07 LAB — GLUCOSE, CAPILLARY: Glucose-Capillary: 121 mg/dL — ABNORMAL HIGH (ref 70–99)

## 2018-05-07 MED ORDER — ONDANSETRON HCL 4 MG/2ML IJ SOLN
INTRAMUSCULAR | Status: AC
  Filled 2018-05-07: qty 2

## 2018-05-07 NOTE — ED Notes (Signed)
Downtime, paper chart disposition  

## 2018-05-07 NOTE — ED Triage Notes (Signed)
Pt to triage via w/c with no distress noted; st N/V since Wed am; denies pain

## 2018-05-18 NOTE — ED Provider Notes (Signed)
[facility] Emergency Department Provider Note    First MD Initiated Contact with Patient 05/07/18 [phone]     (approximate)  I have reviewed the triage vital signs and the nursing notes.   HISTORY  Chief Complaint Emesis    HPI Marc Daniel is a 67 y.o. male with below list of chronic medical conditions presents to the emergency department with history of nausea and vomiting x1 day.  Patient denies any abdominal pain.  Patient denies any diarrhea or constipation.  Patient denies any urinary symptoms no fever.   Past Medical History:  Diagnosis Date  . Diabetes mellitus without complication (HCC)   . Hypertension     There are no active problems to display for this patient.   Past Surgical History:  Procedure Laterality Date  . CATARACT EXTRACTION W/PHACO Right 09/29/2017   Procedure: CATARACT EXTRACTION PHACO AND INTRAOCULAR LENS PLACEMENT (IOC) RIGHT DIABETIC;  Surgeon: Lockie Mola, MD;  Location: [facility];  Service: Ophthalmology;  Laterality: Right;  Diabtic - oral meds  . CATARACT EXTRACTION W/PHACO Left 10/22/2017   Procedure: CATARACT EXTRACTION PHACO AND INTRAOCULAR LENS PLACEMENT (IOC) LEFT DIABETIC;  Surgeon: Lockie Mola, MD;  Location: [facility];  Service: Ophthalmology;  Laterality: Left;  diabetic - oral meds  . COLONOSCOPY      Prior to Admission medications   Medication Sig Start Date End Date Taking? Authorizing Provider  glipiZIDE (GLUCOTROL) 5 MG tablet Take 5 mg by mouth 2 (two) times daily. 10/22/16   [provider]  lisinopril-hydrochlorothiazide (PRINZIDE,ZESTORETIC) 20-25 MG tablet Take 1 tablet by mouth daily. 11/18/16   [provider]  lovastatin (MEVACOR) 20 MG tablet Take 20 mg by mouth daily. 11/18/16   [provider]  metFORMIN (GLUCOPHAGE) 500 MG tablet Take 1,000 mg by mouth 2 (two) times daily. 10/18/16   [provider]    Allergies Sulfa  antibiotics  No family history on file.  Social History Social History   Tobacco Use  . Smoking status: Never Smoker  . Smokeless tobacco: Never Used  Substance Use Topics  . Alcohol use: No    Comment: may have drink on Holidays  . Drug use: Not on file    Review of Systems Constitutional: No fever/chills Eyes: No visual changes. ENT: No sore throat. Cardiovascular: Denies chest pain. Respiratory: Denies shortness of breath. Gastrointestinal: No abdominal pain.  Positive for nausea and vomiting..  No diarrhea.  No constipation. Genitourinary: Negative for dysuria. Musculoskeletal: Negative for neck pain.  Negative for back pain. Integumentary: Negative for rash. Neurological: Negative for headaches, focal weakness or numbness.   ____________________________________________   PHYSICAL EXAM:  VITAL SIGNS: ED Triage Vitals  Enc Vitals Group     BP 05/07/18 0104 (!) 158/87     Pulse Rate 05/07/18 0104 87     Resp 05/07/18 0104 18     Temp 05/07/18 0104 97.7 F (36.5 C)     Temp Source 05/07/18 0104 Oral     SpO2 05/07/18 0104 98 %     Weight 05/07/18 0100 71.7 kg (158 lb)     Height 05/07/18 0100 1.626 m (5\' 4" )     Head Circumference --      Peak Flow --      Pain Score 05/07/18 0100 0     Pain Loc --      Pain Edu? --      Excl. in GC? --     Constitutional: Alert and oriented.  Well appearing and in no acute distress. Eyes: Conjunctivae are normal. . Mouth/Throat: Mucous membranes are moist. Oropharynx non-erythematous. Neck: No stridor.   Cardiovascular: Normal rate, regular rhythm. Good peripheral circulation. Grossly normal heart sounds. Respiratory: Normal respiratory effort.  No retractions. Lungs CTAB. Gastrointestinal: Soft and nontender. No distention.  Musculoskeletal: No lower extremity tenderness nor edema. No gross deformities of extremities. Neurologic:  Normal speech and language. No gross focal neurologic deficits are appreciated.  Skin:   Skin is warm, dry and intact. No rash noted. Psychiatric: Mood and affect are normal. Speech and behavior are normal.  ____________________________________________   LABS (all labs ordered are listed, but only abnormal results are displayed)  Labs Reviewed  COMPREHENSIVE METABOLIC PANEL - Abnormal; Notable for the following components:      Result Value   Potassium 3.4 (*)    Glucose, Bld 124 (*)    Creatinine, Ser 1.46 (*)    GFR calc non Af Amer 47 (*)    GFR calc Af Amer 55 (*)    All other components within normal limits  CBC - Abnormal; Notable for the following components:   MCH 25.8 (*)    All other components within normal limits  URINALYSIS, COMPLETE (UACMP) WITH MICROSCOPIC - Abnormal; Notable for the following components:   Color, Urine YELLOW (*)    APPearance CLEAR (*)    All other components within normal limits  GLUCOSE, CAPILLARY - Abnormal; Notable for the following components:   Glucose-Capillary 121 (*)    All other components within normal limits  LIPASE, BLOOD   ____________   Procedures   ____________________________________________   INITIAL IMPRESSION / ASSESSMENT AND PLAN / ED COURSE  As part of my medical decision making, I reviewed the following data within the electronic MEDICAL RECORD NUMBER   67 year old male presenting to the emergency department with nausea and vomiting x24 hours.  Patient with no abdominal pain on exam and no reported abdominal discomfort.  Laboratory data unremarkable revealing baseline creatinine of 1.46 previous creatinine was 1.49.  Patient given Zofran in the emergency department and IV normal saline no further nausea or vomiting while in the emergency department. ____________________________________________  FINAL CLINICAL IMPRESSION(S) / ED DIAGNOSES  Final diagnoses:  Non-intractable vomiting with nausea, unspecified vomiting type     MEDICATIONS GIVEN DURING THIS VISIT:  Medications - No data to  display   ED Discharge Orders    None       Note:  This document was prepared using Dragon voice recognition software and may include unintentional dictation errors.    Darci CurrentBrown, [place], MD 05/18/18 [phone]

## 2018-06-23 ENCOUNTER — Other Ambulatory Visit: Payer: Self-pay

## 2018-06-23 ENCOUNTER — Ambulatory Visit (INDEPENDENT_AMBULATORY_CARE_PROVIDER_SITE_OTHER): Payer: BLUE CROSS/BLUE SHIELD | Admitting: Urology

## 2018-06-23 ENCOUNTER — Encounter: Payer: Self-pay | Admitting: Urology

## 2018-06-23 VITALS — BP 124/69 | HR 78 | Ht 64.0 in | Wt 157.1 lb

## 2018-06-23 DIAGNOSIS — R972 Elevated prostate specific antigen [PSA]: Secondary | ICD-10-CM | POA: Diagnosis not present

## 2018-06-23 MED ORDER — DIAZEPAM 5 MG PO TABS: 5.0000 mg | ORAL_TABLET | Freq: Once | ORAL | 0 refills | Status: DC | PRN

## 2018-06-23 NOTE — Progress Notes (Signed)
06/23/2018 9:16 AM   Marc Daniel [birth year] [id number]  Referring provider: Nira Daniel [address] [place], [place] [address]  CC: Elevated PSA  HPI: I had the pleasure of meeting Marc Daniel in urology clinic today in consultation for elevated PSA from Dr. Burnett Daniel.  He is a 67 year old African-American male with a family history of prostate cancer in his father.  His father passed away at age 68, it is unclear if he died of prostate cancer or other comorbidities.  He has had a steady rise in his PSA from 2.2 in October 2010 to 4.89 in June 2019.  Recheck PSA in September 2019 remained elevated at 5.32.  His comorbidities include a well-controlled diabetes, hypertension, and hypercholesterolemia.  He is not on any blood thinners.  He has never undergone prostate biopsy.  He denies any significant lower urinary tract symptoms.  He does have mild erectile dysfunction, however he is not interested in trying medications at this time.  There are no aggravating or alleviating factors.  Severity is mild to moderate.  No prior imaging to evaluate prostate size.   PMH: Past Medical History:  Diagnosis Date  . Diabetes mellitus without complication (HCC)   . Hypertension     Surgical History: Past Surgical History:  Procedure Laterality Date  . CATARACT EXTRACTION W/PHACO Right 09/29/2017   Procedure: CATARACT EXTRACTION PHACO AND INTRAOCULAR LENS PLACEMENT (IOC) RIGHT DIABETIC;  Surgeon: Lockie Mola, MD;  Location: [facility];  Service: Ophthalmology;  Laterality: Right;  Diabtic - oral meds  . CATARACT EXTRACTION W/PHACO Left 10/22/2017   Procedure: CATARACT EXTRACTION PHACO AND INTRAOCULAR LENS PLACEMENT (IOC) LEFT DIABETIC;  Surgeon: Lockie Mola, MD;  Location: [facility];  Service: Ophthalmology;  Laterality: Left;  diabetic - oral meds  . COLONOSCOPY      Allergies:  Allergies  Allergen Reactions  . Sulfa Antibiotics Itching     Family History: Family History  Problem Relation Age of Onset  . Prostate cancer Father   . Bladder Cancer Neg Hx   . Kidney cancer Neg Hx     Social History:  reports that he has never smoked. He has never used smokeless tobacco. He reports that he does not drink alcohol. His drug history is not on file.  ROS: Please see flowsheet from today's date for complete review of systems.  Physical Exam: BP 124/69   Pulse 78   Ht 5\' 4"  (1.626 m)   Wt 157 lb 1.6 oz (71.3 kg)   BMI 26.97 kg/m    Constitutional:  Alert and oriented, No acute distress. Cardiovascular: No clubbing, cyanosis, or edema. Respiratory: Normal respiratory effort, no increased work of breathing. GI: Abdomen is soft, nontender, nondistended, no abdominal masses GU: No CVA tenderness DRE: 30 g, firm throughout but no discrete nodules or masses Lymph: No cervical or inguinal lymphadenopathy. Skin: No rashes, bruises or suspicious lesions. Neurologic: Grossly intact, no focal deficits, moving all 4 extremities. Psychiatric: Normal mood and affect.  Laboratory Data: PSA history  04/2018: 5.32 01/2018: 4.89 09/2016: 3.62 06/2016: 3.76 05/2014: 2.2  Pertinent Imaging: None to review  Assessment & Plan:   In summary, Marc Daniel is a healthy 67 year old African-American male with a family history of prostate cancer with steady rise in his PSA over the last few years to a peak of 5.32 in September 2019.  We reviewed the implications of an elevated PSA and the uncertainty surrounding it. In general, a man's PSA increases with age and  is produced by both normal and cancerous prostate tissue. The differential diagnosis for elevated PSA includes BPH, prostate cancer, infection, recent intercourse/ejaculation, recent urethroscopic manipulation (foley placement/cystoscopy) or trauma, and prostatitis.   Management of an elevated PSA can include observation or prostate biopsy and we discussed this in detail. Our goal is  to detect clinically significant prostate cancers, and manage with either active surveillance, surgery, or radiation for localized disease. Risks of prostate biopsy include bleeding, infection (including life threatening sepsis), pain, and lower urinary symptoms. Hematuria, hematospermia, and blood in the stool are all common after biopsy and can persist up to 4 weeks.   With his PSA trend, African-American heritage, and family history of prostate cancer, I strongly recommended proceeding with prostate biopsy to evaluate for prostate cancer.  He is amenable to prostate biopsy, however he would like to defer until after January 1, as he has some family affairs to arrange.  RTC for prostate biopsy January 2020  [name], MD  [facility] [address] [place], [place] [address] [phone]

## 2018-09-08 ENCOUNTER — Other Ambulatory Visit: Payer: Self-pay | Admitting: Urology

## 2018-09-08 ENCOUNTER — Encounter: Payer: Self-pay | Admitting: Urology

## 2018-09-08 ENCOUNTER — Ambulatory Visit (INDEPENDENT_AMBULATORY_CARE_PROVIDER_SITE_OTHER): Payer: BLUE CROSS/BLUE SHIELD | Admitting: Urology

## 2018-09-08 VITALS — BP 146/80 | HR 93 | Ht 64.0 in | Wt 160.0 lb

## 2018-09-08 DIAGNOSIS — R972 Elevated prostate specific antigen [PSA]: Secondary | ICD-10-CM

## 2018-09-08 MED ORDER — LEVOFLOXACIN 500 MG PO TABS
500.0000 mg | ORAL_TABLET | Freq: Once | ORAL | Status: AC
  Administered 2018-09-08: 500 mg via ORAL

## 2018-09-08 MED ORDER — GENTAMICIN SULFATE 40 MG/ML IJ SOLN
80.0000 mg | Freq: Once | INTRAMUSCULAR | Status: AC
  Administered 2018-09-08: 80 mg via INTRAMUSCULAR

## 2018-09-08 NOTE — Addendum Note (Signed)
Addended by: Martha Clan on: 09/08/2018 01:28 PM   Modules accepted: Orders

## 2018-09-08 NOTE — Progress Notes (Signed)
   09/08/18  Indication: Elevated PSA, 5.3  Prostate Biopsy Procedure   Informed consent was obtained, and we discussed the risks of bleeding and infection/sepsis. A time out was performed to ensure correct patient identity.  Pre-Procedure: - Last PSA Level: 5.3 - Gentamicin and levaquin given for antibiotic prophylaxis - Transrectal Ultrasound performed revealing a 68 gm prostate, PSA density 0.08 - No significant hypoechoic or median lobe noted  Procedure: - Prostate block performed using 10 cc 1% lidocaine and biopsies taken from sextant areas, a total of 12 under ultrasound guidance.  Post-Procedure: - Patient tolerated the procedure well - He was counseled to seek immediate medical attention if experiences significant bleeding, fevers, or severe pain - Return in one week to discuss biopsy results  Assessment/ Plan: Will follow up in 1-2 weeks to discuss pathology  Legrand Rams, MD 09/08/2018

## 2018-09-17 LAB — PATHOLOGY REPORT

## 2018-09-18 ENCOUNTER — Other Ambulatory Visit: Payer: Self-pay | Admitting: Urology

## 2018-09-22 ENCOUNTER — Ambulatory Visit (INDEPENDENT_AMBULATORY_CARE_PROVIDER_SITE_OTHER): Payer: BLUE CROSS/BLUE SHIELD | Admitting: Urology

## 2018-09-22 ENCOUNTER — Encounter: Payer: Self-pay | Admitting: Urology

## 2018-09-22 VITALS — BP 145/79 | HR 82 | Ht 64.0 in | Wt 162.0 lb

## 2018-09-22 DIAGNOSIS — R972 Elevated prostate specific antigen [PSA]: Secondary | ICD-10-CM

## 2018-09-22 DIAGNOSIS — I10 Essential (primary) hypertension: Secondary | ICD-10-CM | POA: Insufficient documentation

## 2018-09-22 DIAGNOSIS — E785 Hyperlipidemia, unspecified: Secondary | ICD-10-CM | POA: Insufficient documentation

## 2018-09-22 DIAGNOSIS — K649 Unspecified hemorrhoids: Secondary | ICD-10-CM | POA: Insufficient documentation

## 2018-09-22 DIAGNOSIS — E119 Type 2 diabetes mellitus without complications: Secondary | ICD-10-CM | POA: Insufficient documentation

## 2018-09-22 NOTE — Patient Instructions (Signed)

## 2018-09-22 NOTE — Progress Notes (Signed)
   09/22/2018 11:39 AM   Mickle Malloryonnell Staci RighterM Almario Sep 13, 1950 [id number]  Reason for visit: Discuss prostate biopsy results  HPI: Mr. Marvis MoellerMiles is a 68 year old African-American male here today to discuss prostate biopsy results.  He underwent a biopsy on 09/08/2018 for an elevated PSA of 5.3.  Truss showed a 68 g prostate with PSA density of 0.08.  There was a single core of ASAP on the biopsy, and otherwise showed only benign prostatic tissue.  We discussed the etiology of ASAP at length including the approximately 40 to 50% chance of finding prostate cancer on a follow-up biopsy.  We discussed the AUA recommendations for repeat prostate biopsy in 3 to 6 months.  He had a fair amount of discomfort from his last biopsy and he would like to avoid repeat biopsy if possible.  I recommended obtaining a 4K score in approximately 6 weeks prior to undergoing repeat biopsy.  If 4K score suggest very low risk for high high-grade prostate cancer, could consider deferring repeat biopsy at this time.  We discussed the low, but not 0 risk of missing a clinically significant prostate cancer.  RTC 6 weeks for 4K score, pursue repeat prostate biopsy if suggest risks of clinically significant prostate cancer  A total of 15 minutes were spent face-to-face with the patient, greater than 50% was spent in patient education, counseling, and coordination of care regarding prostate biopsy results, diagnosis of ASAP, recommendations for repeat biopsy, and role of the 4K score.[name].   Markos Theil C Jaylene Arrowood, MD  [facility] [address] [place] [address], [address] [place], [place] [id number] [phone]

## 2018-11-05 ENCOUNTER — Other Ambulatory Visit: Payer: Self-pay

## 2018-11-05 ENCOUNTER — Ambulatory Visit (INDEPENDENT_AMBULATORY_CARE_PROVIDER_SITE_OTHER): Payer: BLUE CROSS/BLUE SHIELD

## 2018-11-05 DIAGNOSIS — R972 Elevated prostate specific antigen [PSA]: Secondary | ICD-10-CM | POA: Diagnosis not present

## 2018-11-05 NOTE — Progress Notes (Signed)
Patient presents today for 4K score. Patient tolerated well. Specimen sent by [facility].

## 2018-11-06 ENCOUNTER — Ambulatory Visit: Payer: Medicare Other

## 2019-01-06 ENCOUNTER — Other Ambulatory Visit: Payer: Self-pay | Admitting: Urology

## 2019-01-07 NOTE — Progress Notes (Signed)
Please set up a telephone visit within the next 3-4 weeks to discuss 4K lab results.  Legrand Rams, MD 01/07/2019

## 2019-02-02 ENCOUNTER — Telehealth (INDEPENDENT_AMBULATORY_CARE_PROVIDER_SITE_OTHER): Payer: BC Managed Care – PPO | Admitting: Urology

## 2019-02-02 ENCOUNTER — Other Ambulatory Visit: Payer: Self-pay

## 2019-02-02 DIAGNOSIS — R972 Elevated prostate specific antigen [PSA]: Secondary | ICD-10-CM | POA: Diagnosis not present

## 2019-02-02 NOTE — Progress Notes (Signed)
Virtual Visit via Telephone Note  I connected with Marc Daniel on 02/02/19 at 10:00 AM EDT by telephone and verified that I am speaking with the correct person using two identifiers.   I discussed the limitations, risks, security and privacy concerns of performing an evaluation and management service by telephone and the availability of in person appointments. We discussed the impact of the COVID-19 on the healthcare system, and the importance of social distancing and reducing patient and provider exposure. I also discussed with the patient that there may be a patient responsible charge related to this service. The patient expressed understanding and agreed to proceed.  Reason for visit: Elevated PSA, history of ASAP, discuss 4k score results  History of Present Illness: I had phone follow-up with Marc Daniel today to discuss his 4K score results.  To briefly summarize, he is a healthy 68 year old African-American male who originally underwent prostate biopsy on 09/08/2018 for an elevated PSA of 5.3.  This showed a 68 g prostate with PSA density of 0.08, and there was a single small focus of ASAP in 1 core, but otherwise showed benign prostatic tissue.  We had discussed guidelines recommending repeat biopsy, however the patient had a significant amount of pain with his original biopsy and deferred repeat biopsy.  We had compromised with obtaining a 4K score to evaluate his risk.  4K score showed stable PSA of 5.27, 24% free, and only 4% risk of clinically significant prostate cancer.   Assessment and Plan: We discussed all of his PSA work-up, and his reassuring findings of negative biopsy aside from small focus of ASAP, reassuring PSA density of 0.08, stable PSA, reassuring 24% free PSA, and most importantly his low risk of having clinically significant prostate cancer on repeat biopsy based on 4K score result of 4%.  We again discussed that we cannot guarantee that he will not develop a clinically  significant prostate cancer, however based on the data we have from the 4K score we would estimate a less than 1% chance of developing metastasis from a prostate cancer over the next 10 years.  We discussed these results at length, and the patient is in agreement with the plan.  Follow Up: Repeat PSA in 1 year, if stable consider discontinuing screening per AUA guidelines   I discussed the assessment and treatment plan with the patient. The patient was provided an opportunity to ask questions and all were answered. The patient agreed with the plan and demonstrated an understanding of the instructions.   The patient was advised to call back or seek an in-person evaluation if the symptoms worsen or if the condition fails to improve as anticipated.  I provided 15 minutes of non-face-to-face time during this encounter.   Billey Co, MD

## 2020-01-26 ENCOUNTER — Other Ambulatory Visit: Payer: Self-pay

## 2020-01-26 DIAGNOSIS — R972 Elevated prostate specific antigen [PSA]: Secondary | ICD-10-CM

## 2020-01-27 ENCOUNTER — Other Ambulatory Visit
Admission: RE | Admit: 2020-01-27 | Discharge: 2020-01-27 | Disposition: A | Discharge status: Home / Self Care | Payer: BC Managed Care – PPO | Attending: Urology | Admitting: Urology

## 2020-01-27 ENCOUNTER — Other Ambulatory Visit: Payer: Self-pay

## 2020-01-27 DIAGNOSIS — R972 Elevated prostate specific antigen [PSA]: Secondary | ICD-10-CM | POA: Diagnosis present

## 2020-01-27 LAB — PSA: Prostatic Specific Antigen: 5.4 ng/mL — ABNORMAL HIGH (ref 0.00–4.00)

## 2020-01-28 ENCOUNTER — Other Ambulatory Visit: Payer: Medicare Other

## 2020-02-03 ENCOUNTER — Encounter: Payer: Self-pay | Admitting: Urology

## 2020-02-03 ENCOUNTER — Ambulatory Visit (INDEPENDENT_AMBULATORY_CARE_PROVIDER_SITE_OTHER): Payer: BC Managed Care – PPO | Admitting: Urology

## 2020-02-03 ENCOUNTER — Other Ambulatory Visit: Payer: Self-pay

## 2020-02-03 VITALS — BP 170/77 | HR 69 | Ht 63.0 in | Wt 154.0 lb

## 2020-02-03 DIAGNOSIS — R972 Elevated prostate specific antigen [PSA]: Secondary | ICD-10-CM

## 2020-02-03 DIAGNOSIS — Z125 Encounter for screening for malignant neoplasm of prostate: Secondary | ICD-10-CM

## 2020-02-03 NOTE — Patient Instructions (Signed)
Prostate Cancer Screening  Prostate cancer screening is a test that is done to check for the presence of prostate cancer in men. The prostate gland is a walnut-sized gland that is located below the bladder and in front of the rectum in males. The function of the prostate is to add fluid to semen during ejaculation. Prostate cancer is the second most common type of cancer in men. Who should have prostate cancer screening?  Screening recommendations vary based on age and other risk factors. Screening is recommended if:  You are older than age 55. If you are age 55-69, talk with your health care provider about your need for screening and how often screening should be done. Because most prostate cancers are slow growing and will not cause death, screening is generally reserved in this age group for men who have a 10-15-year life expectancy.  You are younger than age 55, and you have these risk factors: ? Being a black male or a male of African descent. ? Having a father, brother, or uncle who has been diagnosed with prostate cancer. The risk is higher if your family member's cancer occurred at an early age. Screening is not recommended if:  You are younger than age 40.  You are between the ages of 40 and 54 and you have no risk factors.  You are 70 years of age or older. At this age, the risks that screening can cause are greater than the benefits that it may provide. If you are at high risk for prostate cancer, your health care provider may recommend that you have screenings more often or that you start screening at a younger age. How is screening for prostate cancer done? The recommended prostate cancer screening test is a blood test called the prostate-specific antigen (PSA) test. PSA is a protein that is made in the prostate. As you age, your prostate naturally produces more PSA. Abnormally high PSA levels may be caused by:  Prostate cancer.  An enlarged prostate that is not caused by cancer  (benign prostatic hyperplasia, BPH). This condition is very common in older men.  A prostate gland infection (prostatitis). Depending on the PSA results, you may need more tests, such as:  A physical exam to check the size of your prostate gland.  Blood and imaging tests.  A procedure to remove tissue samples from your prostate gland for testing (biopsy). What are the benefits of prostate cancer screening?  Screening can help to identify cancer at an early stage, before symptoms start and when the cancer can be treated more easily.  There is a small chance that screening may lower your risk of dying from prostate cancer. The chance is small because prostate cancer is a slow-growing cancer, and most men with prostate cancer die from a different cause. What are the risks of prostate cancer screening? The main risk of prostate cancer screening is diagnosing and treating prostate cancer that would never have caused any symptoms or problems. This is called overdiagnosisand overtreatment. PSA screening cannot tell you if your PSA is high due to cancer or a different cause. A prostate biopsy is the only procedure to diagnose prostate cancer. Even the results of a biopsy may not tell you if your cancer needs to be treated. Slow-growing prostate cancer may not need any treatment other than monitoring, so diagnosing and treating it may cause unnecessary stress or other side effects. A prostate biopsy may also cause:  Infection or fever.  A false negative. This is   a result that shows that you do not have prostate cancer when you actually do have prostate cancer. Questions to ask your health care provider  When should I start prostate cancer screening?  What is my risk for prostate cancer?  How often do I need screening?  What type of screening tests do I need?  How do I get my test results?  What do my results mean?  Do I need treatment? Where to find more information  The American Cancer  Society: www.cancer.org  American Urological Association: www.auanet.org Contact a health care provider if:  You have difficulty urinating.  You have pain when you urinate or ejaculate.  You have blood in your urine or semen.  You have pain in your back or in the area of your prostate. Summary  Prostate cancer is a common type of cancer in men. The prostate gland is located below the bladder and in front of the rectum. This gland adds fluid to semen during ejaculation.  Prostate cancer screening may identify cancer at an early stage, when the cancer can be treated more easily.  The prostate-specific antigen (PSA) test is the recommended screening test for prostate cancer.  Discuss the risks and benefits of prostate cancer screening with your health care provider. If you are age 70 or older, the risks that screening can cause are greater than the benefits that it may provide. This information is not intended to replace advice given to you by your health care provider. Make sure you discuss any questions you have with your health care provider. Document Revised: 03/25/2019 Document Reviewed: 03/25/2019 Elsevier Patient Education  2020 Elsevier Inc.  

## 2020-02-03 NOTE — Progress Notes (Signed)
   02/03/2020 9:37 AM   Mickle Mallory Staci Righter [birth year] [id number]  Reason for visit: Follow up PSA screening  HPI: I saw [name] in urology clinic today for follow-up of PSA screening.  To briefly summarize, he is a healthy 69 year old African-American male who originally underwent a prostate biopsy on 09/08/2018 for an elevated PSA of 5.3.  This showed a 60 g prostate with a PSA density of 0.08, and there is a single small focus of ASAP in 1 core, but otherwise benign prostate tissue.  I recommended a repeat biopsy per guidelines, however he deferred and ultimately opted for a 4K score.  This showed a stable PSA of 5.27, 24% free, and only 4% risk of finding clinically significant prostate cancer on a repeat biopsy.  At that time, we opted to repeat a PSA in 1 year, and discontinue screening if PSA was stable.  Most recent PSA in June 2021 is unchanged at 5.4.  He denies any bothersome urinary symptoms.  He does not have any nocturia.  He occasionally has some urinary frequency during the day if he drinks diet sodas.  Overall he is doing very well.  We reviewed his PSA history and work-up at length.  I recommended discontinuing PSA screening per the AUA guideline recommendations, and the patient is in agreement.  We discussed the very low, but not 0, risk of developing a clinically significant prostate cancer in the next 15 to 20 years.  Discontinue PSA screening per AUA guidelines Follow-up as needed  I spent 20 total minutes on the day of the encounter including pre-visit review of the medical record, face-to-face time with the patient, and post visit ordering of labs/imaging/tests.  [name], MD  [facility] [address], [address] [place], [place] [address] [phone]

## 2021-05-22 ENCOUNTER — Other Ambulatory Visit: Payer: Self-pay | Admitting: Nephrology

## 2021-05-22 DIAGNOSIS — I129 Hypertensive chronic kidney disease with stage 1 through stage 4 chronic kidney disease, or unspecified chronic kidney disease: Secondary | ICD-10-CM

## 2021-05-22 DIAGNOSIS — R8290 Unspecified abnormal findings in urine: Secondary | ICD-10-CM

## 2021-05-22 DIAGNOSIS — E1122 Type 2 diabetes mellitus with diabetic chronic kidney disease: Secondary | ICD-10-CM

## 2021-05-22 DIAGNOSIS — R809 Proteinuria, unspecified: Secondary | ICD-10-CM

## 2021-05-22 DIAGNOSIS — N1832 Chronic kidney disease, stage 3b: Secondary | ICD-10-CM

## 2021-06-04 ENCOUNTER — Other Ambulatory Visit: Payer: Self-pay

## 2021-06-04 ENCOUNTER — Ambulatory Visit
Admission: RE | Admit: 2021-06-04 | Discharge: 2021-06-04 | Disposition: A | Discharge status: Home / Self Care | Payer: BC Managed Care – PPO | Source: Ambulatory Visit | Attending: Nephrology | Admitting: Nephrology

## 2021-06-04 DIAGNOSIS — N1832 Chronic kidney disease, stage 3b: Secondary | ICD-10-CM

## 2021-06-04 DIAGNOSIS — I129 Hypertensive chronic kidney disease with stage 1 through stage 4 chronic kidney disease, or unspecified chronic kidney disease: Secondary | ICD-10-CM | POA: Diagnosis present

## 2021-06-04 DIAGNOSIS — R8290 Unspecified abnormal findings in urine: Secondary | ICD-10-CM | POA: Insufficient documentation

## 2021-06-04 DIAGNOSIS — E1122 Type 2 diabetes mellitus with diabetic chronic kidney disease: Secondary | ICD-10-CM | POA: Insufficient documentation

## 2021-06-04 DIAGNOSIS — R809 Proteinuria, unspecified: Secondary | ICD-10-CM | POA: Diagnosis present

## 2021-06-22 ENCOUNTER — Emergency Department: Payer: BC Managed Care – PPO

## 2021-06-22 ENCOUNTER — Emergency Department
Admission: EM | Admit: 2021-06-22 | Discharge: 2021-06-22 | Disposition: A | Discharge status: Home / Self Care | Payer: BC Managed Care – PPO | Attending: Emergency Medicine | Admitting: Emergency Medicine

## 2021-06-22 ENCOUNTER — Other Ambulatory Visit: Payer: Self-pay

## 2021-06-22 DIAGNOSIS — Z7984 Long term (current) use of oral hypoglycemic drugs: Secondary | ICD-10-CM | POA: Diagnosis not present

## 2021-06-22 DIAGNOSIS — E1122 Type 2 diabetes mellitus with diabetic chronic kidney disease: Secondary | ICD-10-CM | POA: Diagnosis not present

## 2021-06-22 DIAGNOSIS — N189 Chronic kidney disease, unspecified: Secondary | ICD-10-CM | POA: Diagnosis not present

## 2021-06-22 DIAGNOSIS — N200 Calculus of kidney: Secondary | ICD-10-CM

## 2021-06-22 DIAGNOSIS — I10 Essential (primary) hypertension: Secondary | ICD-10-CM

## 2021-06-22 DIAGNOSIS — Z79899 Other long term (current) drug therapy: Secondary | ICD-10-CM | POA: Insufficient documentation

## 2021-06-22 DIAGNOSIS — N132 Hydronephrosis with renal and ureteral calculous obstruction: Secondary | ICD-10-CM | POA: Insufficient documentation

## 2021-06-22 DIAGNOSIS — I129 Hypertensive chronic kidney disease with stage 1 through stage 4 chronic kidney disease, or unspecified chronic kidney disease: Secondary | ICD-10-CM | POA: Diagnosis not present

## 2021-06-22 DIAGNOSIS — R1031 Right lower quadrant pain: Secondary | ICD-10-CM | POA: Diagnosis present

## 2021-06-22 LAB — URINALYSIS, COMPLETE (UACMP) WITH MICROSCOPIC
Bacteria, UA: NONE SEEN
Bilirubin Urine: NEGATIVE
Glucose, UA: 150 mg/dL — AB
Ketones, ur: NEGATIVE mg/dL
Leukocytes,Ua: NEGATIVE
Nitrite: NEGATIVE
Protein, ur: 30 mg/dL — AB
Specific Gravity, Urine: 1.01 (ref 1.005–1.030)
pH: 5 (ref 5.0–8.0)

## 2021-06-22 LAB — COMPREHENSIVE METABOLIC PANEL
ALT: 18 U/L (ref 0–44)
AST: 19 U/L (ref 15–41)
Albumin: 4.1 g/dL (ref 3.5–5.0)
Alkaline Phosphatase: 65 U/L (ref 38–126)
Anion gap: 6 (ref 5–15)
BUN: 30 mg/dL — ABNORMAL HIGH (ref 8–23)
CO2: 26 mmol/L (ref 22–32)
Calcium: 9.2 mg/dL (ref 8.9–10.3)
Chloride: 106 mmol/L (ref 98–111)
Creatinine, Ser: 1.97 mg/dL — ABNORMAL HIGH (ref 0.61–1.24)
GFR, Estimated: 35 mL/min — ABNORMAL LOW (ref >60)
Glucose, Bld: 183 mg/dL — ABNORMAL HIGH (ref 70–99)
Potassium: 3.5 mmol/L (ref 3.5–5.1)
Sodium: 138 mmol/L (ref 135–145)
Total Bilirubin: 0.5 mg/dL (ref 0.3–1.2)
Total Protein: 7.5 g/dL (ref 6.5–8.1)

## 2021-06-22 LAB — CBC
HCT: 38.9 % — ABNORMAL LOW (ref 39.0–52.0)
Hemoglobin: 12.3 g/dL — ABNORMAL LOW (ref 13.0–17.0)
MCH: 26.4 pg (ref 26.0–34.0)
MCHC: 31.6 g/dL (ref 30.0–36.0)
MCV: 83.5 fL (ref 80.0–100.0)
Platelets: 274 10*3/uL (ref 150–400)
RBC: 4.66 MIL/uL (ref 4.22–5.81)
RDW: 12.2 % (ref 11.5–15.5)
WBC: 6.8 10*3/uL (ref 4.0–10.5)
nRBC: 0 % (ref 0.0–0.2)

## 2021-06-22 MED ORDER — HYDROCODONE-ACETAMINOPHEN 5-325 MG PO TABS
2.0000 | ORAL_TABLET | Freq: Four times a day (QID) | ORAL | 0 refills | Status: DC | PRN
Start: 2021-06-22 — End: 2021-07-12

## 2021-06-22 MED ORDER — ONDANSETRON 4 MG PO TBDP: 4.0000 mg | ORAL_TABLET | Freq: Four times a day (QID) | ORAL | 0 refills | Status: DC | PRN

## 2021-06-22 NOTE — Discharge Instructions (Addendum)
Dr. Keane Scrape office will contact you to set up a procedure to have your right-sided kidney stone removed.  If you begin having worsening pain that is uncontrolled with medication at home, vomiting that does not stop, fever of 100.4 or higher, or unable to urinate for several hours, please return to the emergency department.  I recommend that you follow-up closely with your primary care doctor to discuss recommendations for blood pressure control given your blood pressure was elevated in the emergency department.  You are being provided a prescription for opiates (also known as narcotics) for pain control.  Opiates can be addictive and should only be used when absolutely necessary for pain control when other alternatives do not work.  We recommend you only use them for the recommended amount of time and only as prescribed.  Please do not take with other sedative medications or alcohol.  Please do not drive, operate machinery, make important decisions while taking opiates.  Please note that these medications can be addictive and have high abuse potential.  Patients can become addicted to narcotics after only taking them for a few days.  Please keep these medications locked away from children, teenagers or any family members with history of substance abuse.  Narcotic pain medicine may also make you constipated.  You may use over-the-counter medications such as MiraLAX, Colace to prevent constipation.  If you become constipated you may use over-the-counter enemas as needed.  Itching and nausea are common side effects of narcotic pain medication.  If you develop uncontrolled vomiting or a rash, please stop these medications.    You are being provided a prescription for opiates (also known as narcotics) for pain control.  Opiates can be addictive and should only be used when absolutely necessary for pain control when other alternatives do not work.  We recommend you only use them for the recommended amount of  time and only as prescribed.  Please do not take with other sedative medications or alcohol.  Please do not drive, operate machinery, make important decisions while taking opiates.  Please note that these medications can be addictive and have high abuse potential.  Patients can become addicted to narcotics after only taking them for a few days.  Please keep these medications locked away from children, teenagers or any family members with history of substance abuse.  Narcotic pain medicine may also make you constipated.  You may use over-the-counter medications such as MiraLAX, Colace to prevent constipation.  If you become constipated you may use over-the-counter enemas as needed.  Itching and nausea are common side effects of narcotic pain medication.  If you develop uncontrolled vomiting or a rash, please stop these medications.

## 2021-06-22 NOTE — ED Triage Notes (Signed)
Pt in with co intermittent flank pain x 1 week. States tonight he noticed pain would worsen upon urination. No hx of the same, no fever or burning on urination.

## 2021-06-22 NOTE — ED Provider Notes (Addendum)
[facility] Emergency Department Provider Note  ____________________________________________   Event Date/Time   First MD Initiated Contact with Patient 06/22/21 [phone]     (approximate)  I have reviewed the triage vital signs and the nursing notes.   HISTORY  Chief Complaint Flank Pain    HPI Marc Daniel is a 70 y.o. male with history of hypertension, diabetes, chronic kidney disease who presents to the emergency department with complaints of right lower back pain that was sharp, moderate in nature that started tonight.  States he got up to go to the bathroom and when he urinated he had burning with urination and pain that radiated into the right lower abdomen.  He has had previous kidney stones but the last was years ago.  He denies any dysuria currently.  No hematuria.  States his pain has almost completely resolved.  No fevers.  No abdominal pain now.  No vomiting or diarrhea.        Past Medical History:  Diagnosis Date   Diabetes mellitus without complication (HCC)    Hypertension     Patient Active Problem List   Diagnosis Date Noted   Diabetes (HCC) 09/22/2018   Hemorrhoids 09/22/2018   Hyperlipidemia 09/22/2018   Hypertension 09/22/2018   Elevated PSA 06/23/2018    Past Surgical History:  Procedure Laterality Date   CATARACT EXTRACTION W/PHACO Right 09/29/2017   Procedure: CATARACT EXTRACTION PHACO AND INTRAOCULAR LENS PLACEMENT (IOC) RIGHT DIABETIC;  Surgeon: Lockie Mola, MD;  Location: [facility];  Service: Ophthalmology;  Laterality: Right;  Diabtic - oral meds   CATARACT EXTRACTION W/PHACO Left 10/22/2017   Procedure: CATARACT EXTRACTION PHACO AND INTRAOCULAR LENS PLACEMENT (IOC) LEFT DIABETIC;  Surgeon: Lockie Mola, MD;  Location: [facility];  Service: Ophthalmology;  Laterality: Left;  diabetic - oral meds   COLONOSCOPY      Prior to Admission medications   Medication Sig Start Date End Date  Taking? Authorizing Provider  HYDROcodone-acetaminophen (NORCO/VICODIN) 5-325 MG tablet Take 2 tablets by mouth every 6 (six) hours as needed. 06/22/21  Yes Josuha Fontanez N, DO  ondansetron (ZOFRAN ODT) 4 MG disintegrating tablet Take 1 tablet (4 mg total) by mouth every 6 (six) hours as needed for nausea or vomiting. 06/22/21  Yes Torrell Krutz, Baxter Hire N, DO  diazepam (VALIUM) 5 MG tablet Take 1 tablet (5 mg total) by mouth once as needed for up to 1 dose for anxiety. Take 30 minutes prior to prostate biopsy 06/23/18   [name], MD  glipiZIDE (GLUCOTROL) 5 MG tablet Take 5 mg by mouth 2 (two) times daily. 10/22/16   [provider]  lisinopril-hydrochlorothiazide (PRINZIDE,ZESTORETIC) 20-25 MG tablet Take 1 tablet by mouth daily. 11/18/16   [provider]  lovastatin (MEVACOR) 20 MG tablet Take 20 mg by mouth daily. 11/18/16   [provider]  metFORMIN (GLUCOPHAGE) 500 MG tablet Take 1,000 mg by mouth 2 (two) times daily. 10/18/16   [provider]    Allergies Sulfa antibiotics  Family History  Problem Relation Age of Onset   Prostate cancer Father    Bladder Cancer Neg Hx    Kidney cancer Neg Hx     Social History Social History   Tobacco Use   Smoking status: Never   Smokeless tobacco: Never  Vaping Use   Vaping Use: Never used  Substance Use Topics   Alcohol use: No    Comment: may have drink on Holidays    Review of Systems  Constitutional: No fever. Eyes: No visual changes. ENT: No sore throat. Cardiovascular: Denies chest pain. Respiratory: Denies shortness of breath. Gastrointestinal: No nausea, vomiting, diarrhea. Genitourinary: Negative for dysuria. Musculoskeletal: Negative for back pain. Skin: Negative for rash. Neurological: Negative for focal weakness or numbness.  ____________________________________________   PHYSICAL EXAM:  VITAL SIGNS: ED Triage Vitals  Enc Vitals Group     BP 06/22/21 0417 (!) 199/72     Pulse  Rate 06/22/21 0417 70     Resp 06/22/21 0417 20     Temp 06/22/21 0417 98.2 F (36.8 C)     Temp Source 06/22/21 0417 Oral     SpO2 06/22/21 0417 99 %     Weight 06/22/21 0418 156 lb (70.8 kg)     Height 06/22/21 0418 5\' 5"  (1.651 m)     Head Circumference --      Peak Flow --      Pain Score 06/22/21 0418 2     Pain Loc --      Pain Edu? --      Excl. in GC? --    CONSTITUTIONAL: Alert and oriented and responds appropriately to questions. Well-appearing; well-nourished, appears younger than stated age, afebrile and nontoxic HEAD: Normocephalic EYES: Conjunctivae clear, pupils appear equal, EOM appear intact ENT: normal nose; moist mucous membranes NECK: Supple, normal ROM CARD: RRR; S1 and S2 appreciated; no murmurs, no clicks, no rubs, no gallops RESP: Normal chest excursion without splinting or tachypnea; breath sounds clear and equal bilaterally; no wheezes, no rhonchi, no rales, no hypoxia or respiratory distress, speaking full sentences ABD/GI: Normal bowel sounds; non-distended; soft, non-tender, no rebound, no guarding, no peritoneal signs, no hepatosplenomegaly BACK: The back appears normal EXT: Normal ROM in all joints; no deformity noted, no edema; no cyanosis SKIN: Normal color for age and race; warm; no rash on exposed skin NEURO: Moves all extremities equally PSYCH: The patient's mood and manner are appropriate.  ____________________________________________   LABS (all labs ordered are listed, but only abnormal results are displayed)  Labs Reviewed  CBC - Abnormal; Notable for the following components:      Result Value   Hemoglobin 12.3 (*)    HCT 38.9 (*)    All other components within normal limits  COMPREHENSIVE METABOLIC PANEL - Abnormal; Notable for the following components:   Glucose, Bld 183 (*)    BUN 30 (*)    Creatinine, Ser 1.97 (*)    GFR, Estimated 35 (*)    All other components within normal limits  URINALYSIS, COMPLETE (UACMP) WITH  MICROSCOPIC - Abnormal; Notable for the following components:   Color, Urine STRAW (*)    APPearance CLEAR (*)    Glucose, UA 150 (*)    Hgb urine dipstick LARGE (*)    Protein, ur 30 (*)    All other components within normal limits  URINE CULTURE   ____________________________________________  EKG   ____________________________________________  RADIOLOGY I, Marialy Urbanczyk, personally viewed and evaluated these images (plain radiographs) as part of my medical decision making, as well as reviewing the written report by the radiologist.  ED MD interpretation: CT scan shows large right UVJ stone.  Official radiology report(s): CT Renal Stone Study  Result Date: 06/22/2021 CLINICAL DATA:  70 year old male with history of right lower back pain. Suspected kidney stone. EXAM: CT ABDOMEN AND PELVIS WITHOUT CONTRAST TECHNIQUE: Multidetector CT imaging of the abdomen and pelvis was performed following the standard protocol without IV contrast. COMPARISON:  No priors. FINDINGS:  Lower chest: Atherosclerotic calcifications in the descending thoracic aorta. Hepatobiliary: No definite suspicious cystic or solid hepatic lesions are confidently identified on today's noncontrast CT examination. Unenhanced appearance of the gallbladder is normal. Pancreas: No definite pancreatic mass or peripancreatic fluid collections or inflammatory changes are noted on today's noncontrast CT examination. Spleen: Unremarkable. Adrenals/Urinary Tract: At the level of the right ureterovesicular junction there is a very large calculus measuring approximately 2.5 x 1.5 x 1.5 cm. This is associated with mild proximal right hydroureteronephrosis and right-sided perinephric and periureteric soft tissue stranding indicating obstruction. There are multiple low-attenuation lesions in both kidneys, incompletely characterized on today's non-contrast CT examination, but statistically likely to represent cysts. The largest of these is in  the lateral aspect of the interpolar region of the right kidney measuring up to 4.4 cm in diameter. In addition, there is an exophytic 1.5 cm intermediate attenuation (31 HU) lesion in the anterolateral aspect of the lower pole of the left kidney. Urinary bladder is otherwise unremarkable in appearance. Bilateral adrenal glands are normal in appearance. Stomach/Bowel: Unenhanced appearance of the stomach is normal. There is no pathologic dilatation of small bowel or colon. Numerous colonic diverticulae are noted, particularly in the sigmoid colon, without surrounding inflammatory changes to suggest an acute diverticulitis at this time. Normal appendix. Vascular/Lymphatic: Aortic atherosclerosis. No lymphadenopathy noted in the abdomen or pelvis. Reproductive: Prostate gland and seminal vesicles are unremarkable in appearance. Other: No significant volume of ascites.  No pneumoperitoneum. Musculoskeletal: 1.1 cm sclerotic lesion with narrow zone of transition in the right ilium, likely a bone island. There are no aggressive appearing lytic or blastic lesions noted in the visualized portions of the skeleton. IMPRESSION: 1. Large partially obstructive calculus at the right ureterovesicular junction measuring 2.5 x 1.5 x 1.5 cm. 2. Multiple low-attenuation lesions in both kidneys, incompletely characterized but statistically likely to represent simple cysts as suggested on prior ultrasound examination. In addition, there is an intermediate attenuation lesion in the anterior aspect of the lower pole of the left kidney. This is incompletely characterized on today's examination and considered indeterminate. Definitive characterization with nonemergent abdominal MRI with and without IV gadolinium is recommended in the near future to exclude the possibility of neoplasm. 3. Colonic diverticulosis without evidence of acute diverticulitis at this time. 4. Aortic atherosclerosis. Electronically Signed   By: Trudie Reed M.D.    On: 06/22/2021 05:15    ____________________________________________   PROCEDURES  Procedure(s) performed (including Critical Care):  Procedures    ____________________________________________   INITIAL IMPRESSION / ASSESSMENT AND PLAN / ED COURSE  As part of my medical decision making, I reviewed the following data within the electronic MEDICAL RECORD NUMBER History obtained from family, Nursing notes reviewed and incorporated, Labs reviewed , Old chart reviewed, A consult was requested and obtained from this/these consultant(s) Urology, CT reviewed, Notes from prior ED visits, and [place] Controlled Substance Database         Patient here with sudden onset right lower back pain, dysuria.  Differential includes kidney stone, pyelonephritis, UTI.  Less likely appendicitis, colitis, diverticulitis, bowel obstruction.  Abdominal exam is benign.  States he is feeling better and declines any pain medication.  Labs, urine ordered from triage and are currently pending.  Will obtain CT renal study.  ED PROGRESS  5:22 AM  Patient CT scan reviewed by myself and radiology.  Patient has a very large right UVJ stone.  Discussed with Dr. Richardo Hanks with urology who will review imaging to determine further treatment  plan and disposition.  Pain is currently well controlled.  Patient does have chronic kidney disease which appears stable.  His last creatinine was 2.13 on 05/21/2021 based on results in care everywhere on chart review.  Today creatinine is 1.97.  Urine does show sign of superimposed infection.  5:43 AM  Pt still pain-free.  Per urology, patient can be discharged home and clinic will call him so that he can be seen early next week and likely to the OR for ureteroscopy and stone removal at the end of the next week.  Patient and wife at bedside are comfortable with this plan.  Will discharge with pain and nausea medicine.  We did discuss at length return precautions.    6:27 AM  Pt's repeat blood  pressure prior to discharge was 211/99.  Reports his nephrologist recently took him off lisinopril and hydrochlorothiazide due to his chronic kidney disease.  He is on losartan and amlodipine and was supposed to take his dose around 5 AM.  He denies any headache, vision changes, chest pain, shortness of breath, numbness, tingling or weakness.  We will give him his home doses of medications and continue to monitor.  I did discuss with him that he needs to follow-up very closely with his primary care doctor as well as his nephrologist.   7:36 AM  Pt's blood pressure slowly improving.  Still pain-free and asymptomatic.  Will discharge home.   At this time, I do not feel there is any life-threatening condition present. I have reviewed, interpreted and discussed all results (EKG, imaging, lab, urine as appropriate) and exam findings with patient/family. I have reviewed nursing notes and appropriate previous records.  I feel the patient is safe to be discharged home without further emergent workup and can continue workup as an outpatient as needed. Discussed usual and customary return precautions. Patient/family verbalize understanding and are comfortable with this plan.  Outpatient follow-up has been provided as needed. All questions have been answered.  ____________________________________________   FINAL CLINICAL IMPRESSION(S) / ED DIAGNOSES  Final diagnoses:  Right kidney stone  Hypertension, unspecified type     ED Discharge Orders          Ordered    HYDROcodone-acetaminophen (NORCO/VICODIN) 5-325 MG tablet  Every 6 hours PRN        06/22/21 0549    ondansetron (ZOFRAN ODT) 4 MG disintegrating tablet  Every 6 hours PRN        06/22/21 0549            *Please note:  Marc Daniel was evaluated in Emergency Department on 06/22/2021 for the symptoms described in the history of present illness. He was evaluated in the context of the global COVID-19 pandemic, which necessitated  consideration that the patient might be at risk for infection with the SARS-CoV-2 virus that causes COVID-19. Institutional protocols and algorithms that pertain to the evaluation of patients at risk for COVID-19 are in a state of rapid change based on information released by regulatory bodies including the CDC and federal and state organizations. These policies and algorithms were followed during the patient's care in the ED.  Some ED evaluations and interventions may be delayed as a result of limited staffing during and the pandemic.*   Note:  This document was prepared using Dragon voice recognition software and may include unintentional dictation errors.    Jahsiah Carpenter, Layla Maw, DO 06/22/21 0551    Davionte Lusby, Layla Maw, DO 06/22/21 [phone]

## 2021-06-23 LAB — URINE CULTURE: Culture: NO GROWTH

## 2021-06-27 ENCOUNTER — Other Ambulatory Visit: Payer: Self-pay

## 2021-06-27 ENCOUNTER — Encounter: Payer: Self-pay | Admitting: Urology

## 2021-06-27 ENCOUNTER — Ambulatory Visit (INDEPENDENT_AMBULATORY_CARE_PROVIDER_SITE_OTHER): Payer: BC Managed Care – PPO | Admitting: Urology

## 2021-06-27 ENCOUNTER — Ambulatory Visit
Admission: RE | Admit: 2021-06-27 | Discharge: 2021-06-27 | Disposition: A | Discharge status: Home / Self Care | Payer: BC Managed Care – PPO | Source: Ambulatory Visit | Attending: Urology | Admitting: Urology

## 2021-06-27 ENCOUNTER — Other Ambulatory Visit: Payer: Self-pay | Admitting: Urology

## 2021-06-27 ENCOUNTER — Telehealth: Payer: Self-pay | Admitting: Urology

## 2021-06-27 VITALS — BP 178/82 | HR 82 | Ht 63.0 in | Wt 156.0 lb

## 2021-06-27 DIAGNOSIS — N201 Calculus of ureter: Secondary | ICD-10-CM

## 2021-06-27 DIAGNOSIS — N200 Calculus of kidney: Secondary | ICD-10-CM | POA: Insufficient documentation

## 2021-06-27 NOTE — Telephone Encounter (Signed)
Per Dr. Richardo Hanks Patient is to be scheduled for Right Ureteroscopy, Laser Lithotripsy, Ureteral Stent placement  Mr. Corvin was contacted and possible surgical dates were discussed, 06/29/21 was agreed upon for surgery. Patient was directed to call [phone] between 1-3pm the day before surgery to find out surgical arrival time.  Instructions were given not to eat or drink from midnight on the night before surgery and have a driver for the day of surgery. On the surgery day patient was instructed to enter through the [facility] entrance of [facility] report the Same Day Surgery desk.    Reminder of this information was sent via mychart to the patient.

## 2021-06-27 NOTE — Progress Notes (Signed)
[facility] Posting Form   Surgery Date/Time: Date: 06/29/2021  Surgeon: Dr. Legrand Rams, MD  Surgery Location: Day Surgery  Inpt ( No  )   Outpt (Yes)   Obs ( No  )   Diagnosis: N20.1 Right Ureteral Stone  -CPT: [phone]  Surgery: Right URS/LL/Stent  Stop Anticoagulations: No  Cardiac/Medical/Pulmonary Clearance needed: No  *Orders entered into EPIC  Date: 06/27/21   *Case booked in [place]  Date: 06/27/21  *Notified pt of Surgery: Date: 06/27/21  PRE-OP UA & CX: no  *Placed into Prior Authorization Work [place] Date: 06/27/21   Assistant/laser/rep:No

## 2021-06-27 NOTE — Progress Notes (Signed)
Surgical Physician Order Form  * Scheduling expectation :  Friday 11/4  *Length of Case: 1.5 hours  *Clearance needed: no  *Anticoagulation Instructions: May continue all anticoagulants  *Aspirin Instructions: Ok to continue all  *Post-op visit Date/Instructions: TBD  *Diagnosis: Right Ureteral Stone  *Procedure: Ureteroscopy w/laser lithotripsy & stent placement/exchange (33435)  -Admit type: OUTpatient  -Anesthesia: General  -VTE Prophylaxis Standing Order SCD's       Other:   -Standing Lab Orders Per Anesthesia    Lab other: None  -Standing Test orders EKG/Chest x-ray per Anesthesia       Test other:   - Medications:     Ancef 2gm IV   Other Instructions:

## 2021-06-27 NOTE — Progress Notes (Signed)
   06/27/2021 2:08 PM   Mickle Mallory Staci Righter 1951/08/01 [id number]  Reason for visit: Right ureteral stone, history of elevated PSA  HPI: I saw Mr. Hurwitz today for the above issues.  I briefly followed him in the past for mildly elevated PSA and he underwent a negative prostate biopsy, and 4K score showed a only 4% risk of clinically significant prostate cancer and we opted to discontinue PSA screening at that time per the AUA guideline recommendations.  He was recently seen in the ER on 06/22/2021 with severe right-sided flank pain, and CT showed a 2.5 cm suspected right distal ureteral stone with some upstream hydronephrosis.  Interestingly, he also had a renal ultrasound earlier in the month that showed a 2.3 cm lesion near the dependent part of the bladder that was more consistent with a bladder stone.  KUB today shows stone still located at the projected location of the right distal ureter.  I personally viewed and interpreted these images.  He continues to have some right-sided flank pain.  We discussed possible etiologies including a very large right distal ureteral stone versus a large bladder stone causing intermittent obstruction.  Is most likely ureteral stone based on the KUB today that shows the stone still located over the right side of the pelvis, and not dependent in the bladder.  He denies any significant urinary symptoms at baseline.  I recommended proceeding with cystoscopy, right ureteroscopy, laser lithotripsy, stent placement. We specifically discussed the risks ureteroscopy including bleeding, infection/sepsis, stent related symptoms including flank pain/urgency/frequency/incontinence/dysuria, ureteral injury, inability to access stone, or need for staged or additional procedures.  Cystoscopy, right ureteroscopy, laser lithotripsy, stent placement this week  [name], MD  [facility] [address], [address] [place], [place] [address] [phone]

## 2021-06-27 NOTE — Patient Instructions (Signed)
Laser Therapy for Kidney Stones Laser therapy for kidney stones is a procedure to break up small, hard mineral deposits that form in the kidney (kidney stones). The procedure is done using a device that produces a focused beam of light (laser). The laser breaks up kidney stones into pieces that are small enough to be passed out of the body through urination or removed from the body during the procedure. You may need laser therapy if you have kidney stones that are painful or block your urinary tract. This procedure is done by inserting a tube (ureteroscope) into your kidney through the urethral opening. The urethra is the part of the body that drains urine from the bladder. In women, the urethra opens above the vaginal opening. In men, the urethra opens at the tip of the penis. The ureteroscope is inserted through the urethra, and surgical instruments are moved through the bladder and the muscular tube that connects the kidney to the bladder (ureter) until they reach the kidney. Tell a health care provider about: Any allergies you have. All medicines you are taking, including vitamins, herbs, eye drops, creams, and over-the-counter medicines. Any problems you or family members have had with anesthetic medicines. Any blood disorders you have. Any surgeries you have had. Any medical conditions you have. Whether you are pregnant or may be pregnant. What are the risks? Generally, this is a safe procedure. However, problems may occur, including: Infection. Bleeding. Allergic reactions to medicines. Damage to the urethra, bladder, or ureter. Urinary tract infection (UTI). Narrowing of the urethra (urethral stricture). Difficulty passing urine. Blockage of the kidney caused by a fragment of kidney stone. What happens before the procedure? Medicines Ask your health care provider about: Changing or stopping your regular medicines. This is especially important if you are taking diabetes medicines or  blood thinners. Taking medicines such as aspirin and ibuprofen. These medicines can thin your blood. Do not take these medicines unless your health care provider tells you to take them. Taking over-the-counter medicines, vitamins, herbs, and supplements. Eating and drinking Follow instructions from your health care provider about eating and drinking, which may include: 8 hours before the procedure - stop eating heavy meals or foods, such as meat, fried foods, or fatty foods. 6 hours before the procedure - stop eating light meals or foods, such as toast or cereal. 6 hours before the procedure - stop drinking milk or drinks that contain milk. 2 hours before the procedure - stop drinking clear liquids. Staying hydrated Follow instructions from your health care provider about hydration, which may include: Up to 2 hours before the procedure - you may continue to drink clear liquids, such as water, clear fruit juice, black coffee, and plain tea.  General instructions You may have a physical exam before the procedure. You may also have tests, such as imaging tests and blood or urine tests. If your ureter is too narrow, your health care provider may place a soft, flexible tube (stent) inside of it. The stent may be placed days or weeks before your laser therapy procedure. Plan to have someone take you home from the hospital or clinic. If you will be going home right after the procedure, plan to have someone stay with you for 24 hours. Do not use any products that contain nicotine or tobacco for at least 4 weeks before the procedure. These products include cigarettes, e-cigarettes, and chewing tobacco. If you need help quitting, ask your health care provider. Ask your health care provider: How your   surgical site will be marked or identified. What steps will be taken to help prevent infection. These may include: Removing hair at the surgery site. Washing skin with a germ-killing soap. Taking antibiotic  medicine. What happens during the procedure?  An IV will be inserted into one of your veins. You will be given one or more of the following: A medicine to help you relax (sedative). A medicine to numb the area (local anesthetic). A medicine to make you fall asleep (general anesthetic). A ureteroscope will be inserted into your urethra. The ureteroscope will send images to a video screen in the operating room to guide your surgeon to the area of your kidney that will be treated. A small, flexible tube will be threaded through the ureteroscope and into your bladder and ureter, up to your kidney. The laser device will be inserted into your kidney through the tube. Your surgeon will pulse the laser on and off to break up kidney stones. A surgical instrument that has a tiny wire basket may be inserted through the tube into your kidney to remove the pieces of broken kidney stone. The procedure may vary among health care providers and hospitals. What happens after the procedure? Your blood pressure, heart rate, breathing rate, and blood oxygen level will be monitored until you leave the hospital or clinic. You will be given pain medicine as needed. You may continue to receive antibiotics. You may have a stent temporarily placed in your ureter. Do not drive for 24 hours if you were given a sedative during your procedure. You may be given a strainer to collect any stone fragments that you pass in your urine. Your health care provider may have these tested. Summary Laser therapy for kidney stones is a procedure to break up kidney stones into pieces that are small enough to be passed out of the body through urination or removed during the procedure. Follow instructions from your health care provider about eating and drinking before the procedure. During the procedure, the ureteroscope will send images to a video screen to guide your surgeon to the area of your kidney that will be treated. Do not drive  for 24 hours if you were given a sedative during your procedure. This information is not intended to replace advice given to you by your health care provider. Make sure you discuss any questions you have with your health care provider. Dietary Guidelines to Help Prevent Kidney Stones Kidney stones are deposits of minerals and salts that form inside your kidneys. Your risk of developing kidney stones may be greater depending on your diet, your lifestyle, the medicines you take, and whether you have certain medical conditions. Most people can lower their chances of developing kidney stones by following the instructions below. Your dietitian may give you more specific instructions depending on your overall health and the type of kidney stones you tend to develop. What are tips for following this plan? Reading food labels  Choose foods with "no salt added" or "low-salt" labels. Limit your salt (sodium) intake to less than 1,500 mg a day. Choose foods with calcium for each meal and snack. Try to eat about 300 mg of calcium at each meal. Foods that contain 200-500 mg of calcium a serving include: 8 oz (237 mL) of milk, calcium-fortifiednon-dairy milk, and calcium-fortifiedfruit juice. Calcium-fortified means that calcium has been added to these drinks. 8 oz (237 mL) of kefir, yogurt, and soy yogurt. 4 oz (114 g) of tofu. 1 oz (28 g) of cheese.  1 cup (150 g) of dried figs. 1 cup (91 g) of cooked broccoli. One 3 oz (85 g) can of sardines or mackerel. Most people need 1,000-1,500 mg of calcium a day. Talk to your dietitian about how much calcium is recommended for you. Shopping Buy plenty of fresh fruits and vegetables. Most people do not need to avoid fruits and vegetables, even if these foods contain nutrients that may contribute to kidney stones. When shopping for convenience foods, choose: Whole pieces of fruit. Pre-made salads with dressing on the side. Low-fat fruit and yogurt smoothies. Avoid  buying frozen meals or prepared deli foods. These can be high in sodium. Look for foods with live cultures, such as yogurt and kefir. Choose high-fiber grains, such as whole-wheat breads, oat bran, and wheat cereals. Cooking Do not add salt to food when cooking. Place a salt shaker on the table and allow each person to add his or her own salt to taste. Use vegetable protein, such as beans, textured vegetable protein (TVP), or tofu, instead of meat in pasta, casseroles, and soups. Meal planning Eat less salt, if told by your dietitian. To do this: Avoid eating processed or pre-made food. Avoid eating fast food. Eat less animal protein, including cheese, meat, poultry, or fish, if told by your dietitian. To do this: Limit the number of times you have meat, poultry, fish, or cheese each week. Eat a diet free of meat at least 2 days a week. Eat only one serving each day of meat, poultry, fish, or seafood. When you prepare animal protein, cut pieces into small portion sizes. For most meat and fish, one serving is about the size of the palm of your hand. Eat at least five servings of fresh fruits and vegetables each day. To do this: Keep fruits and vegetables on hand for snacks. Eat one piece of fruit or a handful of berries with breakfast. Have a salad and fruit at lunch. Have two kinds of vegetables at dinner. Limit foods that are high in a substance called oxalate. These include: Spinach (cooked), rhubarb, beets, sweet potatoes, and Swiss chard. Peanuts. Potato chips, french fries, and baked potatoes with skin on. Nuts and nut products. Chocolate. If you regularly take a diuretic medicine, make sure to eat at least 1 or 2 servings of fruits or vegetables that are high in potassium each day. These include: Avocado. Banana. Orange, prune, carrot, or tomato juice. Baked potato. Cabbage. Beans and split peas. Lifestyle  Drink enough fluid to keep your urine pale yellow. This is the most  important thing you can do. Spread your fluid intake throughout the day. If you drink alcohol: Limit how much you use to: 0-1 drink a day for women who are not pregnant. 0-2 drinks a day for men. Be aware of how much alcohol is in your drink. In the [place] one drink equals one 12 oz bottle of beer (355 mL), one 5 oz glass of wine (148 mL), or one 1 oz glass of hard liquor (44 mL). Lose weight if told by your health care provider. Work with your dietitian to find an eating plan and weight loss strategies that work best for you. General information Talk to your health care provider and dietitian about taking daily supplements. You may be told the following depending on your health and the cause of your kidney stones: Not to take supplements with vitamin C. To take a calcium supplement. To take a daily probiotic supplement. To take other supplements such as  magnesium, fish oil, or vitamin B6. Take over-the-counter and prescription medicines only as told by your health care provider. These include supplements. What foods should I limit? Limit your intake of the following foods, or eat them as told by your dietitian. Vegetables Spinach. Rhubarb. Beets. Canned vegetables. Rosita Fire. Olives. Baked potatoes with skin. Grains Wheat bran. Baked goods. Salted crackers. Cereals high in sugar. Meats and other proteins Nuts. Nut butters. Large portions of meat, poultry, or fish. Salted, precooked, or cured meats, such as sausages, meat loaves, and hot dogs. Dairy Cheese. Beverages Regular soft drinks. Regular vegetable juice. Seasonings and condiments Seasoning blends with salt. Salad dressings. Soy sauce. Ketchup. Barbecue sauce. Other foods Canned soups. Canned pasta sauce. Casseroles. Pizza. Lasagna. Frozen meals. Potato chips. [place] fries. The items listed above may not be a complete list of foods and beverages you should limit. Contact a dietitian for more information. What foods should I  avoid? Talk to your dietitian about specific foods you should avoid based on the type of kidney stones you have and your overall health. Fruits Grapefruit. The item listed above may not be a complete list of foods and beverages you should avoid. Contact a dietitian for more information. Summary Kidney stones are deposits of minerals and salts that form inside your kidneys. You can lower your risk of kidney stones by making changes to your diet. The most important thing you can do is drink enough fluid. Drink enough fluid to keep your urine pale yellow. Talk to your dietitian about how much calcium you should have each day, and eat less salt and animal protein as told by your dietitian. This information is not intended to replace advice given to you by your health care provider. Make sure you discuss any questions you have with your health care provider. Document Revised: 08/05/2019 Document Reviewed: 08/05/2019 Elsevier Patient Education  2022 [employer].

## 2021-06-29 ENCOUNTER — Encounter: Payer: Self-pay | Admitting: Urology

## 2021-06-29 ENCOUNTER — Ambulatory Visit: Payer: BC Managed Care – PPO | Admitting: Registered Nurse

## 2021-06-29 ENCOUNTER — Ambulatory Visit: Payer: BC Managed Care – PPO

## 2021-06-29 ENCOUNTER — Encounter
Admission: RE | Disposition: A | Discharge status: Home / Self Care | Payer: Self-pay | Source: Home / Self Care | Attending: Urology

## 2021-06-29 ENCOUNTER — Ambulatory Visit
Admission: RE | Admit: 2021-06-29 | Discharge: 2021-06-29 | Disposition: A | Discharge status: Home / Self Care | Payer: BC Managed Care – PPO | Attending: Urology | Admitting: Urology

## 2021-06-29 ENCOUNTER — Other Ambulatory Visit: Payer: Self-pay

## 2021-06-29 DIAGNOSIS — E119 Type 2 diabetes mellitus without complications: Secondary | ICD-10-CM | POA: Diagnosis not present

## 2021-06-29 DIAGNOSIS — N132 Hydronephrosis with renal and ureteral calculous obstruction: Secondary | ICD-10-CM | POA: Insufficient documentation

## 2021-06-29 DIAGNOSIS — N1330 Unspecified hydronephrosis: Secondary | ICD-10-CM | POA: Diagnosis not present

## 2021-06-29 DIAGNOSIS — N201 Calculus of ureter: Secondary | ICD-10-CM

## 2021-06-29 DIAGNOSIS — Z8042 Family history of malignant neoplasm of prostate: Secondary | ICD-10-CM | POA: Insufficient documentation

## 2021-06-29 HISTORY — PX: CYSTOSCOPY W/ RETROGRADES: SHX1426

## 2021-06-29 HISTORY — PX: CYSTOSCOPY/URETEROSCOPY/HOLMIUM LASER/STENT PLACEMENT: SHX6546

## 2021-06-29 LAB — GLUCOSE, CAPILLARY
Glucose-Capillary: 183 mg/dL — ABNORMAL HIGH (ref 70–99)
Glucose-Capillary: 175 mg/dL — ABNORMAL HIGH (ref 70–99)
Glucose-Capillary: 162 mg/dL — ABNORMAL HIGH (ref 70–99)

## 2021-06-29 SURGERY — CYSTOSCOPY/URETEROSCOPY/HOLMIUM LASER/STENT PLACEMENT
Anesthesia: General | Laterality: Right

## 2021-06-29 MED ORDER — HYDROCODONE-ACETAMINOPHEN 5-325 MG PO TABS
1.0000 | ORAL_TABLET | ORAL | 0 refills | Status: AC | PRN
Start: 2021-06-29 — End: 2021-07-02

## 2021-06-29 MED ORDER — GABAPENTIN 300 MG PO CAPS
ORAL_CAPSULE | ORAL | Status: AC
  Filled 2021-06-29: qty 1

## 2021-06-29 MED ORDER — BELLADONNA ALKALOIDS-OPIUM 16.2-60 MG RE SUPP
RECTAL | Status: AC
  Filled 2021-06-29: qty 1

## 2021-06-29 MED ORDER — CHLORHEXIDINE GLUCONATE 0.12 % MT SOLN
OROMUCOSAL | Status: AC
  Filled 2021-06-29: qty 15

## 2021-06-29 MED ORDER — FENTANYL CITRATE (PF) 100 MCG/2ML IJ SOLN
INTRAMUSCULAR | Status: DC | PRN
  Administered 2021-06-29: 25 ug via INTRAVENOUS
  Administered 2021-06-29: 100 ug via INTRAVENOUS

## 2021-06-29 MED ORDER — NITROFURANTOIN MACROCRYSTAL 100 MG PO CAPS: 100.0000 mg | ORAL_CAPSULE | Freq: Every day | ORAL | 0 refills | Status: AC

## 2021-06-29 MED ORDER — CEFAZOLIN SODIUM-DEXTROSE 2-4 GM/100ML-% IV SOLN
2.0000 g | INTRAVENOUS | Status: AC
  Administered 2021-06-29: 2 g via INTRAVENOUS

## 2021-06-29 MED ORDER — MIDAZOLAM HCL 2 MG/2ML IJ SOLN
INTRAMUSCULAR | Status: AC
  Filled 2021-06-29: qty 2

## 2021-06-29 MED ORDER — SODIUM CHLORIDE 0.9 % IR SOLN
Status: DC | PRN
  Administered 2021-06-29: 6400 mL

## 2021-06-29 MED ORDER — ACETAMINOPHEN 10 MG/ML IV SOLN
INTRAVENOUS | Status: DC | PRN
  Administered 2021-06-29: 1000 mg via INTRAVENOUS

## 2021-06-29 MED ORDER — PROPOFOL 10 MG/ML IV BOLUS
INTRAVENOUS | Status: AC
  Filled 2021-06-29: qty 40

## 2021-06-29 MED ORDER — MIDAZOLAM HCL 2 MG/2ML IJ SOLN
INTRAMUSCULAR | Status: DC | PRN
  Administered 2021-06-29: 1 mg via INTRAVENOUS

## 2021-06-29 MED ORDER — LIDOCAINE HCL URETHRAL/MUCOSAL 2 % EX GEL
CUTANEOUS | Status: AC
  Filled 2021-06-29: qty 10

## 2021-06-29 MED ORDER — PROPOFOL 10 MG/ML IV BOLUS
INTRAVENOUS | Status: DC | PRN
  Administered 2021-06-29: 150 mg via INTRAVENOUS

## 2021-06-29 MED ORDER — IOHEXOL 180 MG/ML  SOLN
INTRAMUSCULAR | Status: DC | PRN
  Administered 2021-06-29: 20 mL

## 2021-06-29 MED ORDER — KETOROLAC TROMETHAMINE 30 MG/ML IJ SOLN
INTRAMUSCULAR | Status: AC
  Filled 2021-06-29: qty 1

## 2021-06-29 MED ORDER — ROCURONIUM BROMIDE 100 MG/10ML IV SOLN
INTRAVENOUS | Status: DC | PRN
  Administered 2021-06-29: 50 mg via INTRAVENOUS

## 2021-06-29 MED ORDER — LIDOCAINE HCL (CARDIAC) PF 100 MG/5ML IV SOSY
PREFILLED_SYRINGE | INTRAVENOUS | Status: DC | PRN
  Administered 2021-06-29: 100 mg via INTRAVENOUS

## 2021-06-29 MED ORDER — SODIUM CHLORIDE 0.9 % IV SOLN: INTRAVENOUS | Status: DC

## 2021-06-29 MED ORDER — ORAL CARE MOUTH RINSE: 15.0000 mL | Freq: Once | OROMUCOSAL | Status: AC

## 2021-06-29 MED ORDER — ACETAMINOPHEN 10 MG/ML IV SOLN: 1000.0000 mg | Freq: Once | INTRAVENOUS | Status: DC | PRN

## 2021-06-29 MED ORDER — KETOROLAC TROMETHAMINE 30 MG/ML IJ SOLN
INTRAMUSCULAR | Status: DC | PRN
  Administered 2021-06-29: 15 mg via INTRAVENOUS

## 2021-06-29 MED ORDER — PROMETHAZINE HCL 25 MG/ML IJ SOLN: 6.2500 mg | INTRAMUSCULAR | Status: DC | PRN

## 2021-06-29 MED ORDER — FAMOTIDINE 20 MG PO TABS
ORAL_TABLET | ORAL | Status: AC
  Filled 2021-06-29: qty 1

## 2021-06-29 MED ORDER — SUGAMMADEX SODIUM 200 MG/2ML IV SOLN
INTRAVENOUS | Status: DC | PRN
  Administered 2021-06-29: 200 mg via INTRAVENOUS

## 2021-06-29 MED ORDER — BELLADONNA ALKALOIDS-OPIUM 16.2-60 MG RE SUPP
RECTAL | Status: DC | PRN
  Administered 2021-06-29: 1 via RECTAL

## 2021-06-29 MED ORDER — CEFAZOLIN SODIUM-DEXTROSE 2-4 GM/100ML-% IV SOLN
INTRAVENOUS | Status: AC
  Filled 2021-06-29: qty 100

## 2021-06-29 MED ORDER — OXYCODONE HCL 5 MG/5ML PO SOLN: 5.0000 mg | Freq: Once | ORAL | Status: DC | PRN

## 2021-06-29 MED ORDER — FENTANYL CITRATE (PF) 100 MCG/2ML IJ SOLN: 25.0000 ug | INTRAMUSCULAR | Status: DC | PRN

## 2021-06-29 MED ORDER — DEXMEDETOMIDINE (PRECEDEX) IN NS 20 MCG/5ML (4 MCG/ML) IV SYRINGE
PREFILLED_SYRINGE | INTRAVENOUS | Status: DC | PRN
  Administered 2021-06-29 (×2): 8 ug via INTRAVENOUS

## 2021-06-29 MED ORDER — FENTANYL CITRATE (PF) 100 MCG/2ML IJ SOLN
INTRAMUSCULAR | Status: AC
  Filled 2021-06-29: qty 2

## 2021-06-29 MED ORDER — ACETAMINOPHEN 500 MG PO TABS
ORAL_TABLET | ORAL | Status: AC
  Filled 2021-06-29: qty 2

## 2021-06-29 MED ORDER — ONDANSETRON HCL 4 MG/2ML IJ SOLN
INTRAMUSCULAR | Status: DC | PRN
  Administered 2021-06-29: 4 mg via INTRAVENOUS

## 2021-06-29 MED ORDER — ACETAMINOPHEN 10 MG/ML IV SOLN
INTRAVENOUS | Status: AC
  Filled 2021-06-29: qty 100

## 2021-06-29 MED ORDER — PHENYLEPHRINE HCL-NACL 20-0.9 MG/250ML-% IV SOLN
INTRAVENOUS | Status: DC | PRN
  Administered 2021-06-29: 50 ug/min via INTRAVENOUS

## 2021-06-29 MED ORDER — TAMSULOSIN HCL 0.4 MG PO CAPS: 0.4000 mg | ORAL_CAPSULE | Freq: Every day | ORAL | 0 refills | Status: DC

## 2021-06-29 MED ORDER — EPHEDRINE SULFATE 50 MG/ML IJ SOLN
INTRAMUSCULAR | Status: DC | PRN
  Administered 2021-06-29: 5 mg via INTRAVENOUS

## 2021-06-29 MED ORDER — OXYCODONE HCL 5 MG PO TABS: 5.0000 mg | ORAL_TABLET | Freq: Once | ORAL | Status: DC | PRN

## 2021-06-29 MED ORDER — PROMETHAZINE HCL 25 MG/ML IJ SOLN
INTRAMUSCULAR | Status: AC
  Administered 2021-06-29: 6.25 mg via INTRAVENOUS
  Filled 2021-06-29: qty 1

## 2021-06-29 MED ORDER — CHLORHEXIDINE GLUCONATE 0.12 % MT SOLN
OROMUCOSAL | Status: AC
  Administered 2021-06-29: 15 mL via OROMUCOSAL
  Filled 2021-06-29: qty 15

## 2021-06-29 MED ORDER — CHLORHEXIDINE GLUCONATE 0.12 % MT SOLN: 15.0000 mL | Freq: Once | OROMUCOSAL | Status: AC

## 2021-06-29 SURGICAL SUPPLY — 36 items
BAG DRAIN CYSTO-URO LG1000N (MISCELLANEOUS) ×3 IMPLANT
BASKET ZERO TIP 1.9FR (BASKET) ×3 IMPLANT
BRUSH SCRUB EZ 1% IODOPHOR (MISCELLANEOUS) ×3 IMPLANT
BSKT STON RTRVL ZERO TP 1.9FR (BASKET) ×2
CATH URET FLEX-TIP 2 LUMEN 10F (CATHETERS) IMPLANT
CATH URETL OPEN 5X70 (CATHETERS) ×3 IMPLANT
CNTNR SPEC 2.5X3XGRAD LEK (MISCELLANEOUS) ×2
CONT SPEC 4OZ STER OR WHT (MISCELLANEOUS) ×1
CONT SPEC 4OZ STRL OR WHT (MISCELLANEOUS) ×2
CONTAINER SPEC 2.5X3XGRAD LEK (MISCELLANEOUS) ×2 IMPLANT
DRAPE UTILITY 15X26 TOWEL STRL (DRAPES) ×3 IMPLANT
DRSG TEGADERM 2-3/8X2-3/4 SM (GAUZE/BANDAGES/DRESSINGS) IMPLANT
FIBER LASER FLEXIVA PULSE 365 (Laser) IMPLANT
GAUZE 4X4 16PLY ~~LOC~~+RFID DBL (SPONGE) ×6 IMPLANT
GLOVE SURG UNDER POLY LF SZ7.5 (GLOVE) ×3 IMPLANT
GOWN STRL REUS W/ TWL LRG LVL3 (GOWN DISPOSABLE) ×2 IMPLANT
GOWN STRL REUS W/ TWL XL LVL3 (GOWN DISPOSABLE) ×2 IMPLANT
GOWN STRL REUS W/TWL LRG LVL3 (GOWN DISPOSABLE) ×3
GOWN STRL REUS W/TWL XL LVL3 (GOWN DISPOSABLE) ×3
GUIDEWIRE STR DUAL SENSOR (WIRE) ×3 IMPLANT
GUIDEWIRE STR ZIPWIRE 035X150 (MISCELLANEOUS) ×3 IMPLANT
INFUSOR MANOMETER BAG 3000ML (MISCELLANEOUS) ×6 IMPLANT
IV NS IRRIG 3000ML ARTHROMATIC (IV SOLUTION) ×9 IMPLANT
KIT TURNOVER CYSTO (KITS) ×3 IMPLANT
PACK CYSTO AR (MISCELLANEOUS) ×3 IMPLANT
SET CYSTO W/LG BORE CLAMP LF (SET/KITS/TRAYS/PACK) ×3 IMPLANT
SHEATH URETERAL 12FRX35CM (MISCELLANEOUS) IMPLANT
STENT URET 6FRX24 CONTOUR (STENTS) IMPLANT
STENT URET 6FRX26 CONTOUR (STENTS) ×3 IMPLANT
SURGILUBE 2OZ TUBE FLIPTOP (MISCELLANEOUS) ×3 IMPLANT
SYR 10ML LL (SYRINGE) ×3 IMPLANT
TRACTIP FLEXIVA PULSE ID 200 (Laser) ×3 IMPLANT
VALVE UROSEAL ADJ ENDO (VALVE) IMPLANT
WATER STERILE IRR 1000ML POUR (IV SOLUTION) ×3 IMPLANT
WATER STERILE IRR 500ML POUR (IV SOLUTION) IMPLANT
ureteral catheter open end 5f ×3 IMPLANT

## 2021-06-29 NOTE — Transfer of Care (Signed)
Immediate Anesthesia Transfer of Care Note  Patient: Marc Daniel  Procedure(s) Performed: CYSTOSCOPY/URETEROSCOPY/HOLMIUM LASER/STENT PLACEMENT (Right) CYSTOSCOPY WITH RETROGRADE PYELOGRAM  Patient Location: PACU  Anesthesia Type:General  Level of Consciousness: drowsy  Airway & Oxygen Therapy: Patient Spontanous Breathing and Patient connected to face mask oxygen  Post-op Assessment: Report given to RN and Post -op Vital signs reviewed and stable  Post vital signs: stable  Last Vitals:  Vitals Value Taken Time  BP 137/72 06/29/21 0927  Temp    Pulse 78 06/29/21 0928  Resp 24 06/29/21 0929  SpO2 100 % 06/29/21 0928  Vitals shown include unvalidated device data.  Last Pain:  Vitals:   06/29/21 0624  TempSrc: Oral  PainSc: 1          Complications: No notable events documented.

## 2021-06-29 NOTE — H&P (Signed)
   06/29/21 7:11 AM   Marc Daniel 1951/07/17 [id number]  CC: Right distal ureteral stone  HPI: 70 year old male who presented to the ER with right-sided flank pain and CT suggested a 2 cm right distal ureteral stone with hydronephrosis.  Follow-up KUB showed unchanged location of the stone, and this did not appear dependent within the bladder consistent with a bladder stone, and he has minimal to no urinary symptoms and normal postvoid residuals.  He opted for ureteroscopy.   PMH: Past Medical History:  Diagnosis Date   Diabetes mellitus without complication (HCC)    Hypertension     Surgical History: Past Surgical History:  Procedure Laterality Date   CATARACT EXTRACTION W/PHACO Right 09/29/2017   Procedure: CATARACT EXTRACTION PHACO AND INTRAOCULAR LENS PLACEMENT (IOC) RIGHT DIABETIC;  Surgeon: Lockie Mola, MD;  Location: [facility];  Service: Ophthalmology;  Laterality: Right;  Diabtic - oral meds   CATARACT EXTRACTION W/PHACO Left 10/22/2017   Procedure: CATARACT EXTRACTION PHACO AND INTRAOCULAR LENS PLACEMENT (IOC) LEFT DIABETIC;  Surgeon: Lockie Mola, MD;  Location: [facility];  Service: Ophthalmology;  Laterality: Left;  diabetic - oral meds   COLONOSCOPY       Family History: Family History  Problem Relation Age of Onset   Prostate cancer Father    Bladder Cancer Neg Hx    Kidney cancer Neg Hx     Social History:  reports that he has never smoked. He has never used smokeless tobacco. He reports that he does not drink alcohol. No history on file for drug use.  Physical Exam: BP (!) 201/92   Pulse 78   Temp (!) 97.3 F (36.3 C) (Oral)   Resp 16   Ht 5\' 3"  (1.6 m)   Wt 70.8 kg   SpO2 100%   BMI 27.65 kg/m    Constitutional:  Alert and oriented, No acute distress. Cardiovascular: Regular rate and rhythm Respiratory: Clear to auscultation bilaterally GI: Abdomen is soft, nontender, nondistended, no abdominal  masses   Laboratory Data: Urine culture 10/28 no growth  Pertinent Imaging: I have personally viewed and interpreted the CT and KUB.  Assessment & Plan:   70 year old male with suspected 2 cm right distal ureteral stone with hydronephrosis on CT and intermittent flank pain.  We discussed the possibility that this could also represent a bladder stone, though he has minimal urinary symptoms and normal postvoid residuals.  We discussed possible need for outlet procedure in the future if this truly is a bladder stone and he has new or worsening urinary symptoms.  We specifically discussed the risks ureteroscopy including bleeding, infection/sepsis, stent related symptoms including flank pain/urgency/frequency/incontinence/dysuria, ureteral injury, inability to access stone, or need for staged or additional procedures.  Cystoscopy, right ureteroscopy, right laser lithotripsy, stent placement, possible cystolitholapaxy    59, MD 06/29/2021  [facility] [address] [place], [place] [phone]

## 2021-06-29 NOTE — Discharge Instructions (Signed)

## 2021-06-29 NOTE — Op Note (Signed)
Date of procedure: 06/29/21  Preoperative diagnosis:  Right distal ureteral stone  Postoperative diagnosis:  Same  Procedure: Cystoscopy, right ureteroscopy(22 modifier), laser lithotripsy, basket extraction of fragments, right retrograde pyelogram with intraoperative interpretation, right ureteral stent placement  Surgeon: Legrand Rams, MD  Anesthesia: General  Complications: None  Intraoperative findings:  Large impacted 2.5 cm right distal ureteral stone, fragmented to dust and all pieces extracted, uncomplicated stent placement Normal cystoscopy, no other suspicious lesions  EBL: Minimal  Specimens: Stone for analysis  Drains: Right 6 French by 26 cm ureteral stent  Indication: Marc Daniel is a 70 y.o. patient with intermittent right-sided flank pain and CT showed a large 2.5 cm right distal ureteral stone with mild hydronephrosis.  After reviewing the management options for treatment, they elected to proceed with the above surgical procedure(s). We have discussed the potential benefits and risks of the procedure, side effects of the proposed treatment, the likelihood of the patient achieving the goals of the procedure, and any potential problems that might occur during the procedure or recuperation. Informed consent has been obtained.  Description of procedure:  The patient was taken to the operating room and general anesthesia was induced. SCDs were placed for DVT prophylaxis. The patient was placed in the dorsal lithotomy position, prepped and draped in the usual sterile fashion, and preoperative antibiotics(Ancef) were administered. A preoperative time-out was performed.   A 21 French rigid cystoscope was used to intubate the urethra and a normal-appearing urethra was followed proximally into the bladder.  The prostate was moderate in size with a high bladder neck.  Thorough cystoscopy revealed no suspicious lesions, and right ureter had a large bulge consistent with the  known 2.5 cm right distal stone.  I was able to identify a pinpoint right ureteral orifice with yellow stone visible within.  I was unable to advance a wire past the stone with the cystoscope or access catheter.  Ultimately with the aid of a semirigid ureteroscope I was able to navigate a sensor wire inside the ureteral orifice and alongside the stone, however was only able to advance this a few centimeters in, and would not advance up to the kidney.  At this point I removed the semirigid ureteroscope, and passed it back alongside the wire and was able to enter the distal ureter.  This was completely filled with a solid large yellow and black stone.  The 400 m laser fiber would not connect to the laser, and I ultimately used the 240 m laser fiber on settings of 1.5 J and 15 Hz and the stone was carefully fragmented.  The stone was completely impacted and very large taking up the entire right distal ureter.  This was very challenging secondary to the size and location of the stone, and was meticulously fragmented.  After 45 minutes of fragmentation I was ultimately able to see the true lumen, and a sensor wire was advanced up into the kidney under fluoroscopic vision.  The semirigid ureteroscope was then advanced back alongside the wire, and the old original sensor wire was removed.  At this point I was able to fragment the remaining stone, and the larger pieces were basket extracted.  All stone fragments were irrigated free from the ureter.  A retrograde pyelogram from the distal ureter showed no extravasation.  The rigid cystoscope was backloaded over the wire and a 6 [place] by 26 cm ureteral stent was uneventfully placed with a curl in the upper pole, as well as under direct  vision the bladder.  The bladder was irrigated and drained of all stone fragments.  A belladonna suppository was placed.  22 modifier: Please note this case took 90 minutes secondary to a large and impacted right distal ureteral stone  requiring extremely challenging ureteral access and meticulous laser fragmentation and basket extraction, 60 minutes longer than a standard 30-minute distal ureteral stone.   Disposition: Stable to PACU  Plan: Must void prior to discharge Follow-up in 1 to 2 weeks for stent removal  Legrand Rams, MD

## 2021-06-29 NOTE — Anesthesia Preprocedure Evaluation (Signed)
Anesthesia Evaluation  Patient identified by MRN, date of birth, ID band Patient awake    Reviewed: Allergy & Precautions, NPO status , Patient's Chart, lab work & pertinent test results  History of Anesthesia Complications Negative for: history of anesthetic complications  Airway Mallampati: III  TM Distance: >3 FB Neck ROM: Full    Dental no notable dental hx.    Pulmonary neg pulmonary ROS,    Pulmonary exam normal breath sounds clear to auscultation       Cardiovascular Exercise Tolerance: Good hypertension, Pt. on medications Normal cardiovascular exam Rhythm:Regular Rate:Normal     Neuro/Psych negative neurological ROS  negative psych ROS   GI/Hepatic negative GI ROS, Neg liver ROS,   Endo/Other  diabetes, Well Controlled, Type 2  Renal/GU Renal disease (right ureteral stone)     Musculoskeletal negative musculoskeletal ROS (+)   Abdominal Normal abdominal exam  (+)   Peds  Hematology negative hematology ROS (+)   Anesthesia Other Findings   Reproductive/Obstetrics                             Anesthesia Physical  Anesthesia Plan  ASA: II  Anesthesia Plan: General   Post-op Pain Management:    Induction: Intravenous  PONV Risk Score and Plan: 1 and Ondansetron, Dexamethasone and Treatment may vary due to age or medical condition  Airway Management Planned: Oral ETT  Additional Equipment:   Intra-op Plan:   Post-operative Plan: Extubation in OR  Informed Consent: I have reviewed the patients History and Physical, chart, labs and discussed the procedure including the risks, benefits and alternatives for the proposed anesthesia with the patient or authorized representative who has indicated his/her understanding and acceptance.       Plan Discussed with: CRNA and Anesthesiologist  Anesthesia Plan Comments:         Anesthesia Quick Evaluation

## 2021-06-29 NOTE — Anesthesia Procedure Notes (Signed)
Procedure Name: Intubation Date/Time: 06/29/2021 7:32 AM Performed by: Loleta Dontre Laduca, CRNA Pre-anesthesia Checklist: Patient identified, Patient being monitored, Timeout performed, Emergency Drugs available and Suction available Patient Re-evaluated:Patient Re-evaluated prior to induction Oxygen Delivery Method: Circle system utilized Preoxygenation: Pre-oxygenation with 100% oxygen Induction Type: IV induction Ventilation: Mask ventilation without difficulty Laryngoscope Size: McGraph and 4 Grade View: Grade I Tube type: Oral Tube size: 7.0 mm Number of attempts: 1 Airway Equipment and Method: Stylet Placement Confirmation: ETT inserted through vocal cords under direct vision, positive ETCO2 and breath sounds checked- equal and bilateral Secured at: 22 cm Tube secured with: Tape Dental Injury: Teeth and Oropharynx as per pre-operative assessment

## 2021-06-29 NOTE — Anesthesia Postprocedure Evaluation (Signed)
Anesthesia Post Note  Patient: Marc Daniel  Procedure(s) Performed: CYSTOSCOPY/URETEROSCOPY/HOLMIUM LASER/STENT PLACEMENT (Right) CYSTOSCOPY WITH RETROGRADE PYELOGRAM  Patient location during evaluation: PACU Anesthesia Type: General Level of consciousness: awake and alert Pain management: pain level controlled Vital Signs Assessment: post-procedure vital signs reviewed and stable Respiratory status: spontaneous breathing, nonlabored ventilation, respiratory function stable and patient connected to nasal cannula oxygen Cardiovascular status: blood pressure returned to baseline and stable Postop Assessment: no apparent nausea or vomiting Anesthetic complications: no   No notable events documented.   Last Vitals:  Vitals:   06/29/21 1013 06/29/21 1022  BP: (!) 147/80 (!) 160/83  Pulse: 82 79  Resp: 15 16  Temp: (!) 36 C (!) 36.1 C  SpO2: 100% 100%    Last Pain:  Vitals:   06/29/21 1022  TempSrc: Temporal  PainSc: 0-No pain                 Corinda Gubler

## 2021-06-29 NOTE — Progress Notes (Signed)
Patient unable to void postoperatively, bladder scan 500 mL with lower abdominal discomfort.  Nursing unable to place Foley.  He was prepped and draped in standard sterile fashion and a 20 [place] two-way coud Foley passed easily into the bladder with return of pink urine.  10 mL were placed in the balloon, the catheter was connected to drainage.  Will arrange follow-up for voiding trial in clinic in 2 to 3 days, followed by stent removal in 1 to 2 weeks  Legrand Rams, MD 06/29/2021

## 2021-07-02 ENCOUNTER — Ambulatory Visit: Payer: BC Managed Care – PPO | Admitting: Physician Assistant

## 2021-07-02 ENCOUNTER — Encounter: Payer: Self-pay | Admitting: Urology

## 2021-07-03 ENCOUNTER — Telehealth: Payer: Self-pay | Admitting: Physician Assistant

## 2021-07-03 ENCOUNTER — Other Ambulatory Visit: Payer: Self-pay

## 2021-07-03 ENCOUNTER — Ambulatory Visit: Payer: BC Managed Care – PPO | Admitting: Physician Assistant

## 2021-07-03 ENCOUNTER — Ambulatory Visit (INDEPENDENT_AMBULATORY_CARE_PROVIDER_SITE_OTHER): Payer: BC Managed Care – PPO | Admitting: Physician Assistant

## 2021-07-03 DIAGNOSIS — R338 Other retention of urine: Secondary | ICD-10-CM

## 2021-07-03 DIAGNOSIS — N9989 Other postprocedural complications and disorders of genitourinary system: Secondary | ICD-10-CM

## 2021-07-03 NOTE — Telephone Encounter (Signed)
Patient called and stated that he is able to use the bathroom fine.

## 2021-07-03 NOTE — Progress Notes (Signed)
Patient presented to clinic today for postop voiding trial.  He is POD 4 from cystoscopy and right ureteroscopy with laser lithotripsy, basket extraction, and stent placement of a large impacted 2.5 cm distal right ureteral stone.  He developed postoperative urinary retention but does not have a history of retention or incomplete emptying.  Foley catheter removed in clinic this morning, see procedure note below for further details.  He contacted clinic via telephone in the afternoon to report that he had been able to void spontaneously without difficulty.  Patient to follow-up as scheduled for cystoscopy stent removal with Dr. Richardo Hanks.  Catheter Removal  Patient is present today for a catheter removal.  73ml of water was drained from the balloon. A 20FR coude foley cath was removed from the bladder no complications were noted . Patient tolerated well.  Performed by: Carman Ching, PA-C   Follow up/ Additional notes: Cysto stent removal with Dr. Richardo Hanks (already scheduled)

## 2021-07-06 LAB — CALCULI, WITH PHOTOGRAPH (CLINICAL LAB)
Calcium Oxalate Monohydrate: 60 %
Uric Acid Calculi: 40 %
Weight Calculi: 117 mg

## 2021-07-12 ENCOUNTER — Other Ambulatory Visit: Payer: Self-pay

## 2021-07-12 ENCOUNTER — Encounter: Payer: Self-pay | Admitting: Urology

## 2021-07-12 ENCOUNTER — Ambulatory Visit (INDEPENDENT_AMBULATORY_CARE_PROVIDER_SITE_OTHER): Payer: BC Managed Care – PPO | Admitting: Urology

## 2021-07-12 VITALS — BP 185/86 | HR 84 | Ht 63.0 in | Wt 155.6 lb

## 2021-07-12 DIAGNOSIS — Z466 Encounter for fitting and adjustment of urinary device: Secondary | ICD-10-CM | POA: Diagnosis not present

## 2021-07-12 DIAGNOSIS — N201 Calculus of ureter: Secondary | ICD-10-CM

## 2021-07-12 LAB — URINALYSIS, COMPLETE
Bilirubin, UA: NEGATIVE
Ketones, UA: NEGATIVE
Nitrite, UA: NEGATIVE
Specific Gravity, UA: 1.02 (ref 1.005–1.030)
Urobilinogen, Ur: 0.2 mg/dL (ref 0.2–1.0)
pH, UA: 5.5 (ref 5.0–7.5)

## 2021-07-12 LAB — MICROSCOPIC EXAMINATION
Bacteria, UA: NONE SEEN
RBC, Urine: >30 /hpf — AB (ref 0–2)

## 2021-07-12 MED ORDER — CEPHALEXIN 250 MG PO CAPS
500.0000 mg | ORAL_CAPSULE | Freq: Once | ORAL | Status: AC
  Administered 2021-07-12: 500 mg via ORAL

## 2021-07-12 MED ORDER — LIDOCAINE HCL URETHRAL/MUCOSAL 2 % EX GEL
1.0000 "application " | Freq: Once | CUTANEOUS | Status: AC
  Administered 2021-07-12: 1 via URETHRAL

## 2021-07-12 NOTE — Progress Notes (Signed)
Cystoscopy Procedure Note:  Indication: Stent removal s/p right ureteroscopy for impacted 2.5 cm right distal ureteral stone.  Had postoperative retention requiring coud Foley placement, and passed a void trial 4 days later.  After informed consent and discussion of the procedure and its risks, Marc Daniel was positioned and prepped in the standard fashion. Cystoscopy was performed with a flexible cystoscope. The stent was grasped with flexible graspers and removed in its entirety. The patient tolerated the procedure well.  Keflex given for prophylaxis  Findings: Uncomplicated stent removal  Assessment and Plan: Follow up in 6 weeks with renal ultrasound to evaluate for silent hydronephrosis, and PVR  [name], MD 07/12/2021

## 2021-08-08 ENCOUNTER — Encounter: Payer: Self-pay | Admitting: Urology

## 2021-08-23 ENCOUNTER — Ambulatory Visit: Payer: BC Managed Care – PPO | Admitting: Urology

## 2021-09-03 ENCOUNTER — Other Ambulatory Visit: Payer: Self-pay

## 2021-09-03 ENCOUNTER — Ambulatory Visit
Admission: RE | Admit: 2021-09-03 | Discharge: 2021-09-03 | Disposition: A | Discharge status: Home / Self Care | Payer: BC Managed Care – PPO | Source: Ambulatory Visit | Attending: Urology | Admitting: Urology

## 2021-09-03 DIAGNOSIS — N201 Calculus of ureter: Secondary | ICD-10-CM | POA: Diagnosis present

## 2021-09-03 DIAGNOSIS — Z466 Encounter for fitting and adjustment of urinary device: Secondary | ICD-10-CM | POA: Insufficient documentation

## 2021-09-06 ENCOUNTER — Encounter: Payer: Self-pay | Admitting: Urology

## 2021-09-06 ENCOUNTER — Other Ambulatory Visit: Payer: Self-pay

## 2021-09-06 ENCOUNTER — Ambulatory Visit (INDEPENDENT_AMBULATORY_CARE_PROVIDER_SITE_OTHER): Payer: BC Managed Care – PPO | Admitting: Urology

## 2021-09-06 VITALS — BP 170/85 | HR 71 | Ht 63.0 in | Wt 155.0 lb

## 2021-09-06 DIAGNOSIS — R972 Elevated prostate specific antigen [PSA]: Secondary | ICD-10-CM | POA: Diagnosis not present

## 2021-09-06 DIAGNOSIS — N200 Calculus of kidney: Secondary | ICD-10-CM | POA: Diagnosis not present

## 2021-09-06 DIAGNOSIS — N9989 Other postprocedural complications and disorders of genitourinary system: Secondary | ICD-10-CM

## 2021-09-06 DIAGNOSIS — N401 Enlarged prostate with lower urinary tract symptoms: Secondary | ICD-10-CM | POA: Diagnosis not present

## 2021-09-06 DIAGNOSIS — N138 Other obstructive and reflux uropathy: Secondary | ICD-10-CM

## 2021-09-06 DIAGNOSIS — R338 Other retention of urine: Secondary | ICD-10-CM

## 2021-09-06 LAB — BLADDER SCAN AMB NON-IMAGING

## 2021-09-06 NOTE — Progress Notes (Signed)
° °  09/06/2021 4:08 PM   Ronnie Doss Jessica Priest Sep 27, 1950 [id number]  Reason for visit: Follow up nephrolithiasis, BPH, elevated PSA  HPI: 71 year old male who I had followed for elevated PSA.  He had a negative prostate biopsy in January 2020 for an elevated PSA of 5.3 that showed a 60 g prostate, PSA density of 0.08, and a single small focus of ASAP in 1 core.  He deferred further prostate biopsy, and using shared decision-making he ultimately opted for a 4K score that showed a stable PSA of 5.27, 24% free, and only 4% risk of finding clinically significant prostate cancer.  A repeat PSA 1 year later was unchanged at 5.4 and he opted to discontinue screening.  Unfortunately a PSA was checked on 07/09/2021 just 10 days after surgery and having a catheter for a week, not surprisingly was elevated at 11.66.  This is expected to have a PSA elevation after his recent surgery and catheter placement.  He presented to the ER with severe right-sided flank pain and was ultimately found to have a large impacted 2.5 cm right distal ureteral stone, and underwent a challenging right ureteroscopy on 06/29/2021 with dusting of the large stone and removal of all fragments.  He had postoperative urinary retention that required a Foley catheter placement with a coud Foley.  He has been doing well since that time and denies any flank pain, gross hematuria, or urinary symptoms.  Stone analysis showed 60% calcium oxalate, 40% uric acid.  I personally viewed and interpreted his follow-up renal ultrasound dated 09/03/2021 that shows no hydronephrosis, stones, and normal-appearing bladder.  Return precautions were discussed at length  RTC 1 year with KUB, PVR, can potentially follow-up as needed at that time if doing well   Billey Co, [place] [address] [place], [place] [address] [phone]

## 2022-08-14 IMAGING — CT CT RENAL STONE PROTOCOL
2 of 4 series · 15 of 46 positions shown, 17 images · non-contrast
Comparison: No priors.

CLINICAL DATA: 70-year-old male with history of right lower back
pain. Suspected kidney stone.

EXAM:
CT ABDOMEN AND PELVIS WITHOUT CONTRAST
TECHNIQUE: Multidetector CT imaging of the abdomen and pelvis was performed
following the standard protocol without IV contrast.

[Series 2: stone full standard · axial · 0.69mm/px · z∈[-466,-46]mm · 12 of 94 slices shown, 14 images]
[im 5/94  soft-tissue]
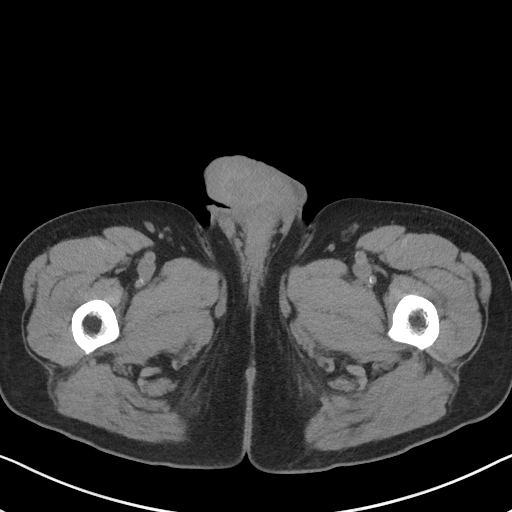
[im 5/94  bone]
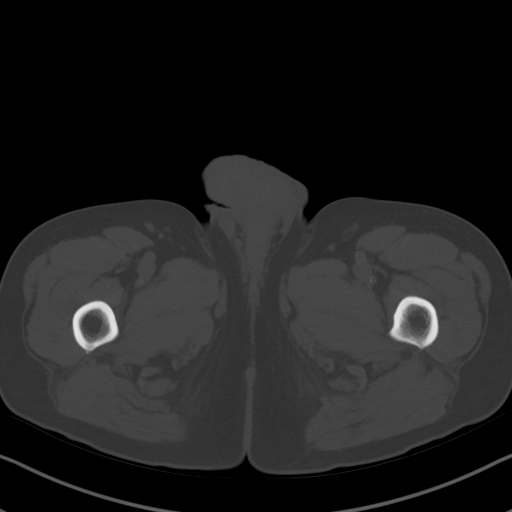
[im 13/94  soft-tissue]
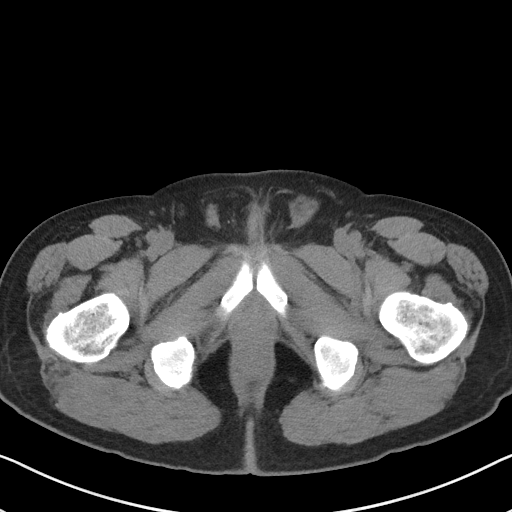
[im 22/94  soft-tissue]
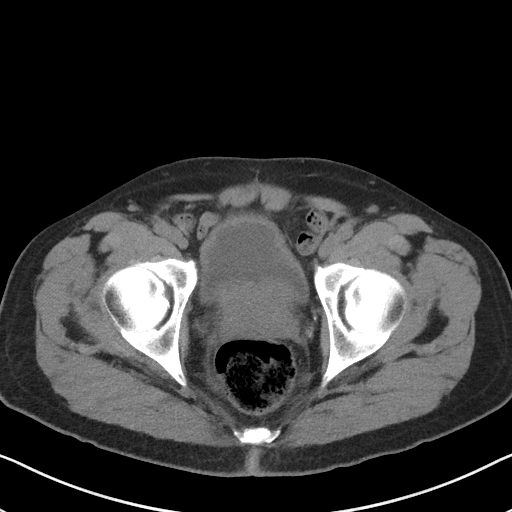
[im 30/94  soft-tissue]
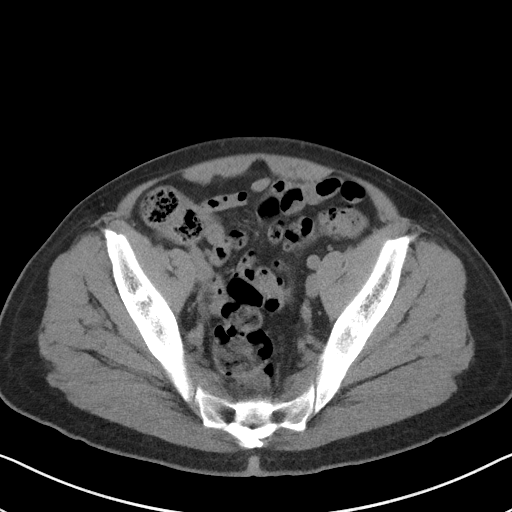
[im 34/94  soft-tissue]
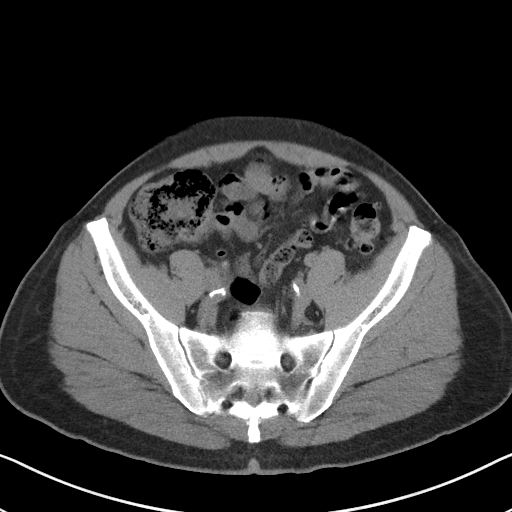
[im 43/94  soft-tissue]
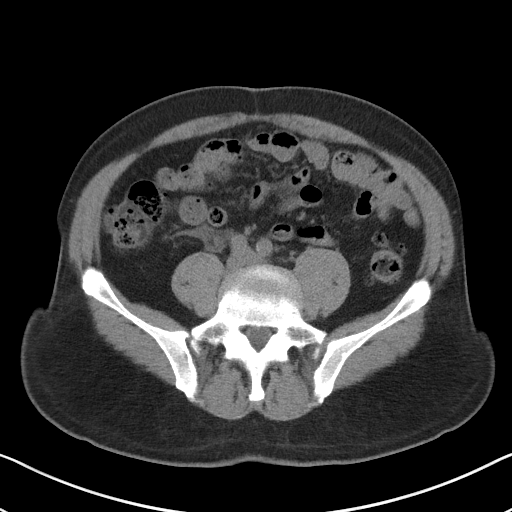
[im 51/94  soft-tissue]
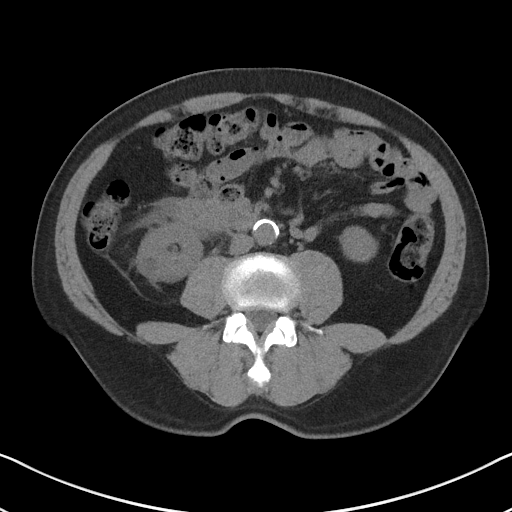
[im 60/94  soft-tissue]
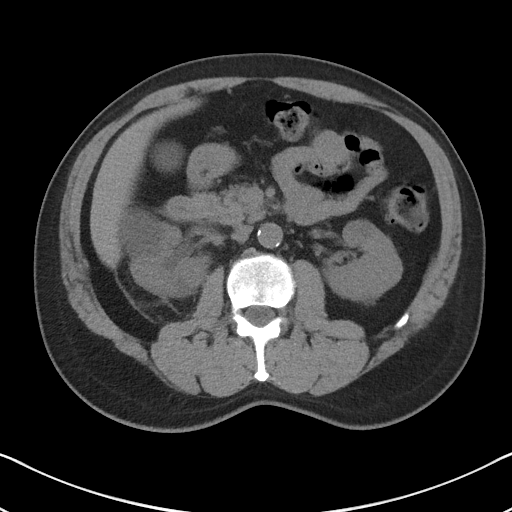
[im 64/94  soft-tissue]
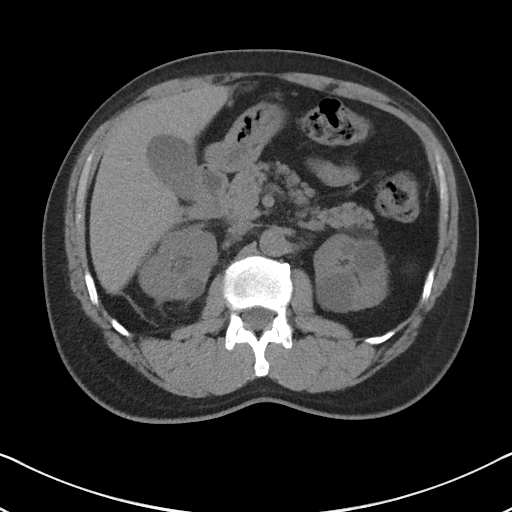
[im 64/94  bone]
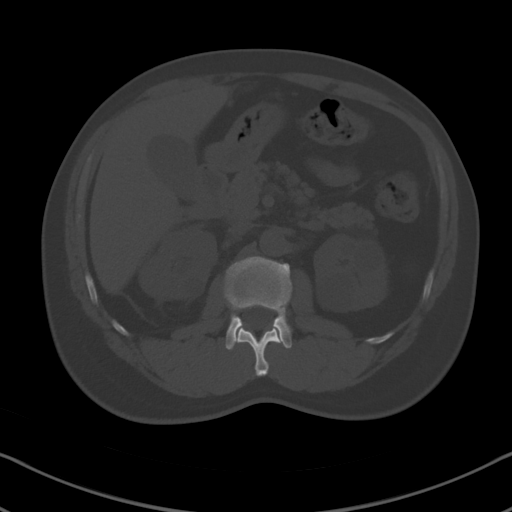
[im 72/94  soft-tissue]
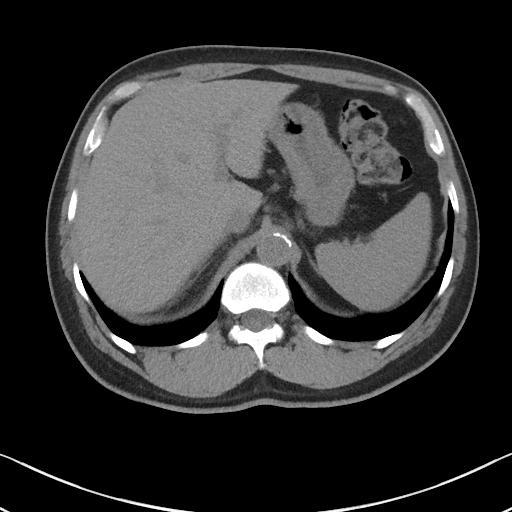
[im 81/94  soft-tissue]
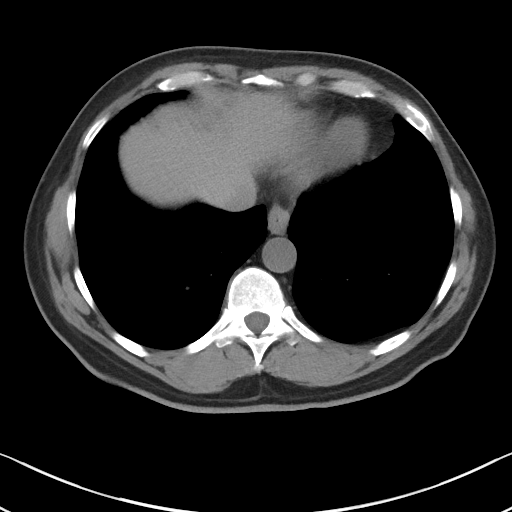
[im 89/94  soft-tissue]
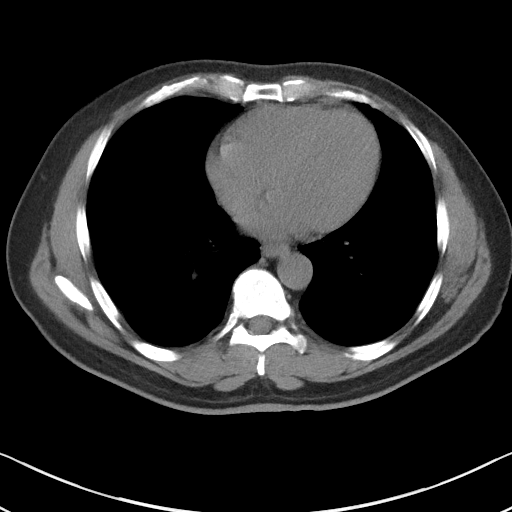

[Series 5: coronal · coronal · 0.71mm/px · 3 of 146 slices shown]
[im 49/146  soft-tissue]
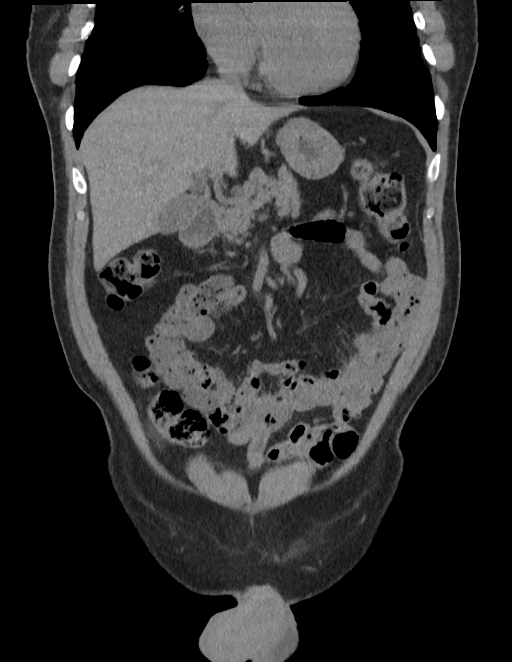
[im 65/146  soft-tissue]
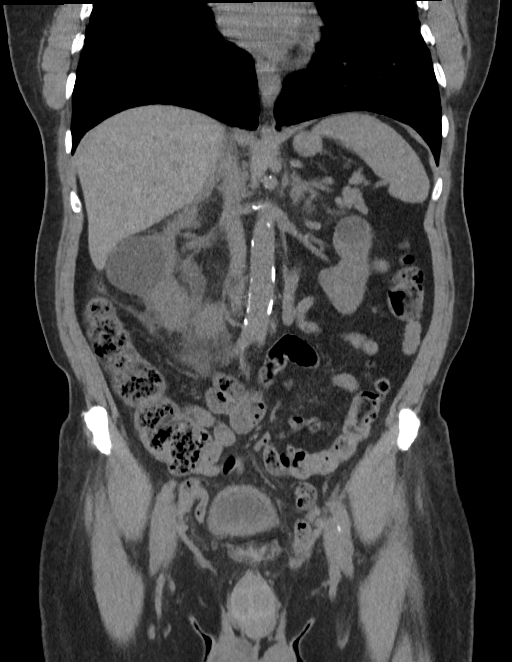
[im 81/146  soft-tissue]
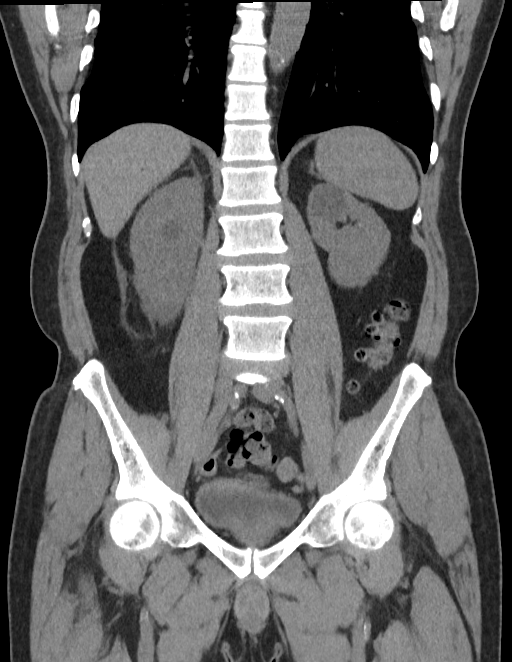

[15 of 46 positions shown; findings below may reference images not displayed]

FINDINGS: Lower chest: Atherosclerotic calcifications in the descending
thoracic aorta.

Hepatobiliary: No definite suspicious cystic or solid hepatic
lesions are confidently identified on today's noncontrast CT
examination. Unenhanced appearance of the gallbladder is normal.

Pancreas: No definite pancreatic mass or peripancreatic fluid
collections or inflammatory changes are noted on today's noncontrast
CT examination.

Spleen: Unremarkable.

Adrenals/Urinary Tract: At the level of the right ureterovesicular
junction there is a very large calculus measuring approximately
x 1.5 x 1.5 cm. This is associated with mild proximal right
hydroureteronephrosis and right-sided perinephric and periureteric
soft tissue stranding indicating obstruction. There are multiple
low-attenuation lesions in both kidneys, incompletely characterized
on today's non-contrast CT examination, but statistically likely to
represent cysts. The largest of these is in the lateral aspect of
the interpolar region of the right kidney measuring up to 4.4 cm in
diameter. In addition, there is an exophytic 1.5 cm intermediate
attenuation (31 HU) lesion in the anterolateral aspect of the lower
pole of the left kidney. Urinary bladder is otherwise unremarkable
in appearance. Bilateral adrenal glands are normal in appearance.

Stomach/Bowel: Unenhanced appearance of the stomach is normal. There
is no pathologic dilatation of small bowel or colon. Numerous
colonic diverticulae are noted, particularly in the sigmoid colon,
without surrounding inflammatory changes to suggest an acute
diverticulitis at this time. Normal appendix.

Vascular/Lymphatic: Aortic atherosclerosis. No lymphadenopathy noted
in the abdomen or pelvis.

Reproductive: Prostate gland and seminal vesicles are unremarkable
in appearance.

Other: No significant volume of ascites.  No pneumoperitoneum.

Musculoskeletal: 1.1 cm sclerotic lesion with narrow zone of
transition in the right ilium, likely a bone island. There are no
aggressive appearing lytic or blastic lesions noted in the
visualized portions of the skeleton.
IMPRESSION: 1. Large partially obstructive calculus at the right
ureterovesicular junction measuring 2.5 x 1.5 x 1.5 cm.
2. Multiple low-attenuation lesions in both kidneys, incompletely
characterized but statistically likely to represent simple cysts as
suggested on prior ultrasound examination. In addition, there is an
intermediate attenuation lesion in the anterior aspect of the lower
pole of the left kidney. This is incompletely characterized on
today's examination and considered indeterminate. Definitive
characterization with nonemergent abdominal MRI with and without IV
gadolinium is recommended in the near future to exclude the
possibility of neoplasm.
3. Colonic diverticulosis without evidence of acute diverticulitis
at this time.
4. Aortic atherosclerosis.

## 2022-09-05 ENCOUNTER — Ambulatory Visit: Payer: BC Managed Care – PPO | Admitting: Urology

## 2022-10-03 ENCOUNTER — Encounter: Payer: Self-pay | Admitting: Urology

## 2022-10-03 ENCOUNTER — Other Ambulatory Visit: Payer: Self-pay

## 2022-10-03 ENCOUNTER — Ambulatory Visit
Admission: RE | Admit: 2022-10-03 | Discharge: 2022-10-03 | Disposition: A | Discharge status: Home / Self Care | Payer: BC Managed Care – PPO | Source: Ambulatory Visit | Attending: Urology | Admitting: Urology

## 2022-10-03 ENCOUNTER — Ambulatory Visit (INDEPENDENT_AMBULATORY_CARE_PROVIDER_SITE_OTHER): Payer: BC Managed Care – PPO | Admitting: Urology

## 2022-10-03 ENCOUNTER — Ambulatory Visit
Admission: RE | Admit: 2022-10-03 | Discharge: 2022-10-03 | Disposition: A | Discharge status: Home / Self Care | Payer: BC Managed Care – PPO | Attending: Urology | Admitting: Urology

## 2022-10-03 VITALS — BP 147/72 | HR 68 | Ht 65.0 in | Wt 162.0 lb

## 2022-10-03 DIAGNOSIS — Z125 Encounter for screening for malignant neoplasm of prostate: Secondary | ICD-10-CM

## 2022-10-03 DIAGNOSIS — N200 Calculus of kidney: Secondary | ICD-10-CM | POA: Insufficient documentation

## 2022-10-03 DIAGNOSIS — N138 Other obstructive and reflux uropathy: Secondary | ICD-10-CM

## 2022-10-03 DIAGNOSIS — N401 Enlarged prostate with lower urinary tract symptoms: Secondary | ICD-10-CM | POA: Diagnosis not present

## 2022-10-03 DIAGNOSIS — Z87442 Personal history of urinary calculi: Secondary | ICD-10-CM | POA: Diagnosis not present

## 2022-10-03 NOTE — Patient Instructions (Signed)

## 2022-10-03 NOTE — Progress Notes (Signed)
   10/03/2022 10:12 AM   Marc Daniel Marc Daniel 02-Dec-1950 [id number]  Reason for visit: Follow up nephrolithiasis, history of elevated PSA, BPH  HPI: 72 year old male here for follow-up of the above issues.  He underwent ureteroscopy in November 2022 for a large 2.5 cm impacted right distal ureteral stone, follow-up renal ultrasound showed no residual hydronephrosis.  He denies any stone events over the last year.  I personally viewed and interpreted his KUB today that shows no evidence of stones.  He is increased his fluid intake.  He also has an history of an elevated PSA of 5.3 and underwent a negative prostate biopsy in January 2020 that showed a 60 g gland, PSA density of 0.08, and a single small focus of ASAP.  He deferred further prostate biopsy, and using shared decision making he opted for a 4K score that showed a stable PSA of 5.27, 24% free, and only 4% chance of finding clinically significant prostate cancer on repeat biopsy.  PSA 1 year later was unchanged at 5.4 and he opted to discontinue screening per guideline recommendations.  Unfortunately he had a PSA checked by PCP in November 2022 just 10 days after surgery when he had a catheter, not surprisingly this was elevated at 11.66.  He has a repeat PSA scheduled with PCP for April.  We reviewed the guidelines at length that do not recommend routine screening in men over age 31, reassurance provided regarding negative biopsy, elevated percentage free, and less than 4% chance of clinically significant prostate cancer on biopsy based on 4K score.  In terms of his BPH and urinary symptoms, really his only complaint is some weak urinary stream overnight.  He has nocturia only once overnight, and drinks fluid prior to bedtime.  We reviewed behavioral strategies.  He is on doxazosin through PCP.  Follow-up with urology as needed   Billey Co, [place] [address] [place], [place]  [address] [phone]

## 2022-10-26 IMAGING — US US RENAL
1 series · 14 of 25 positions shown · non-contrast
Comparison: CT abdomen pelvis dated 06/22/2021.

CLINICAL DATA: History of kidney stones.

EXAM:
RENAL / URINARY TRACT ULTRASOUND COMPLETE

[Series 1: us renal · 14 of 61 slices shown]
[im 1/61]
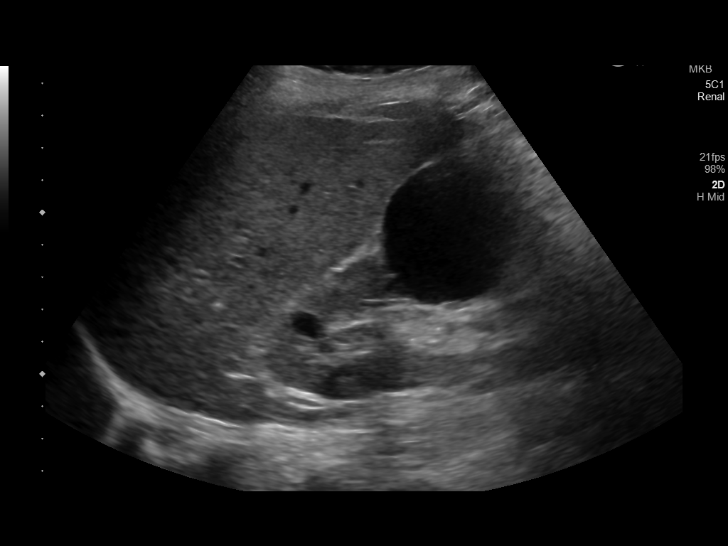
[im 6/61]
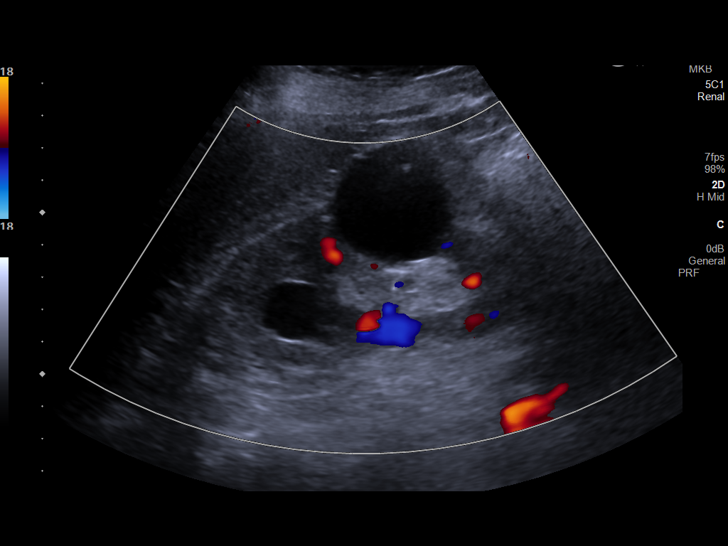
[im 11/61]
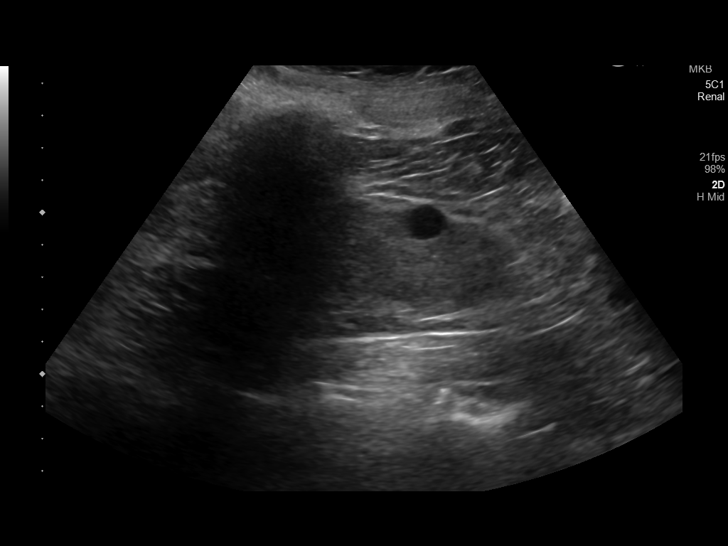
[im 16/61]
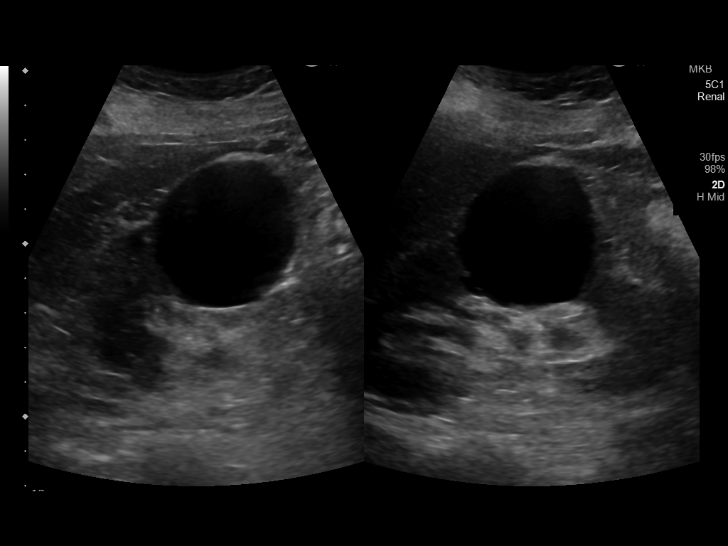
[im 21/61]
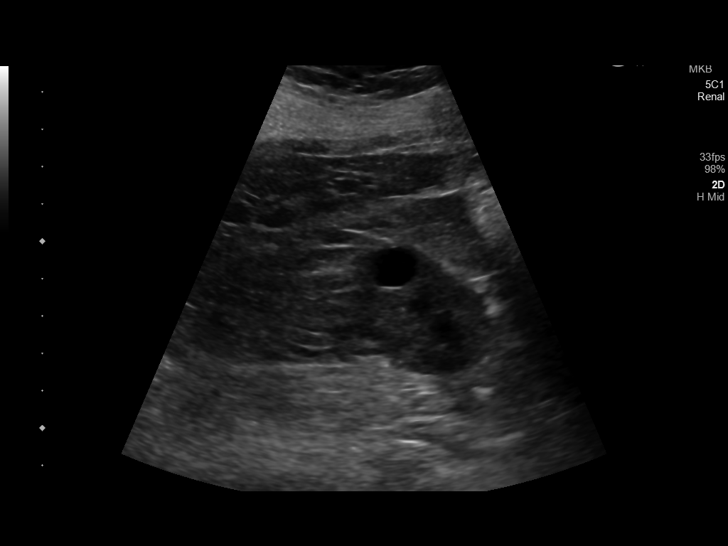
[im 23/61]
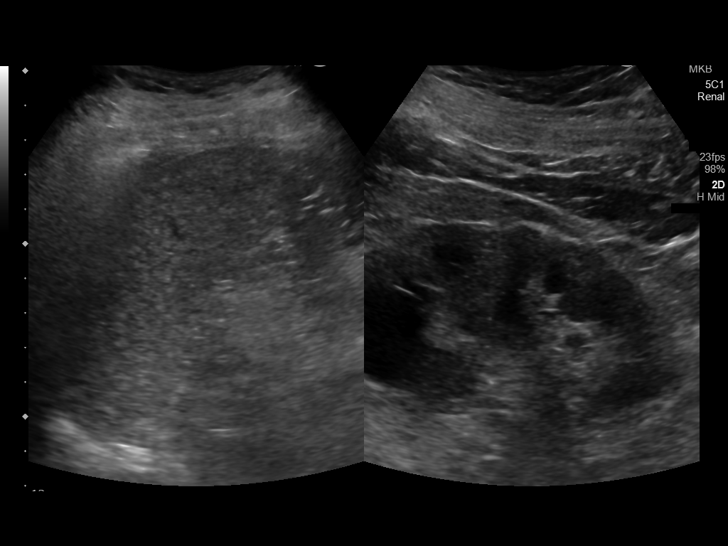
[im 28/61]
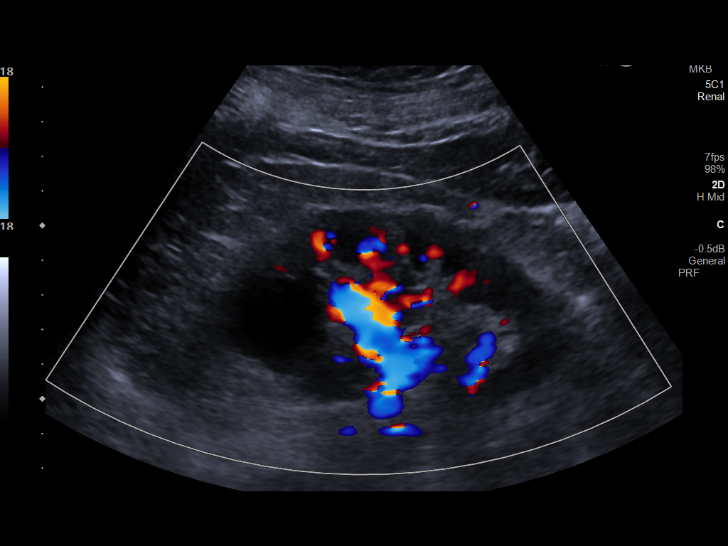
[im 33/61]
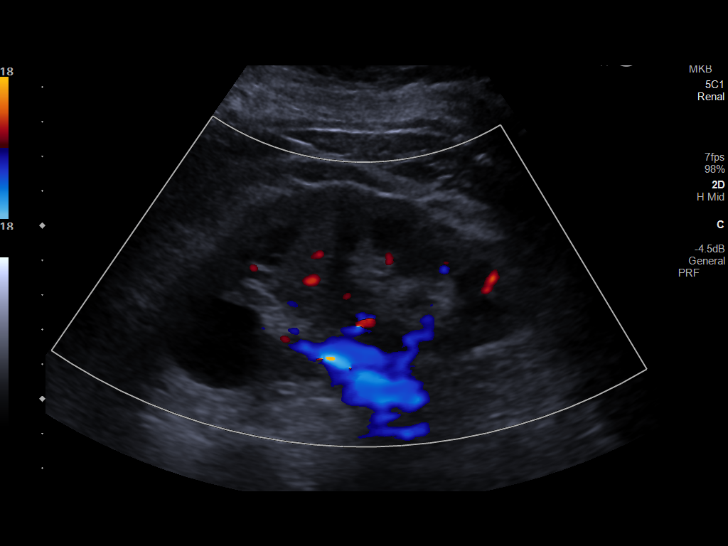
[im 38/61]
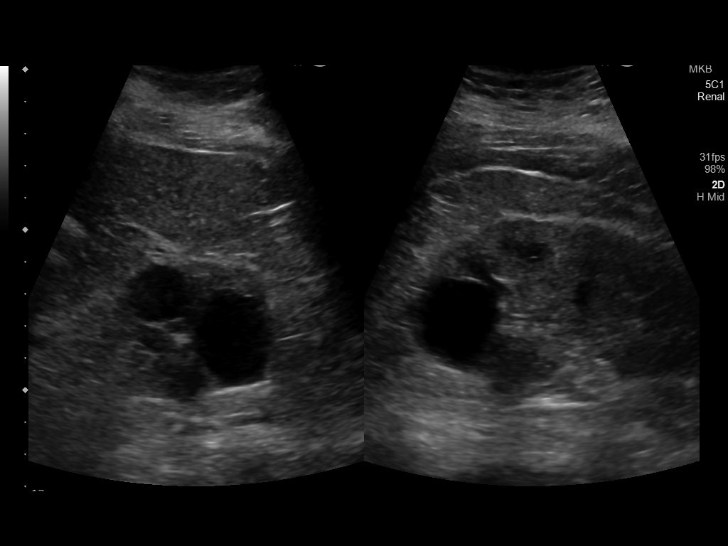
[im 41/61]
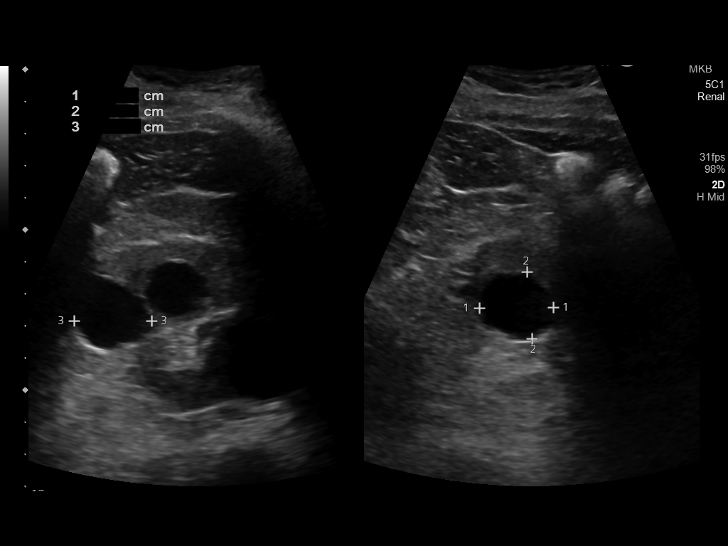
[im 46/61]
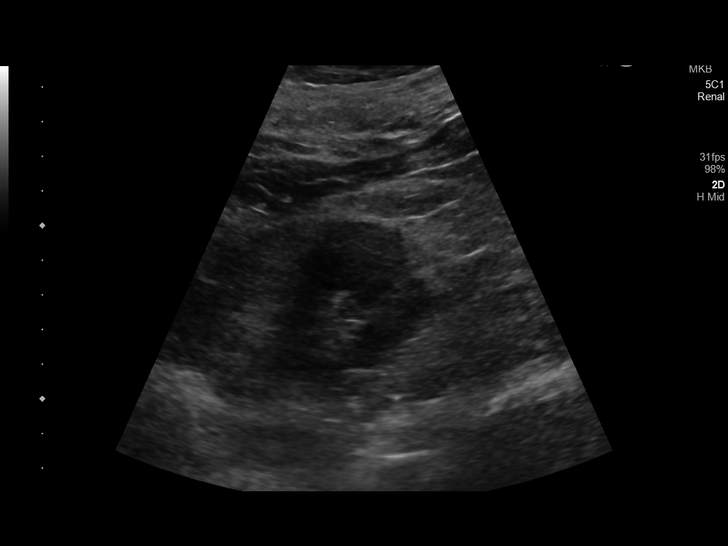
[im 51/61]
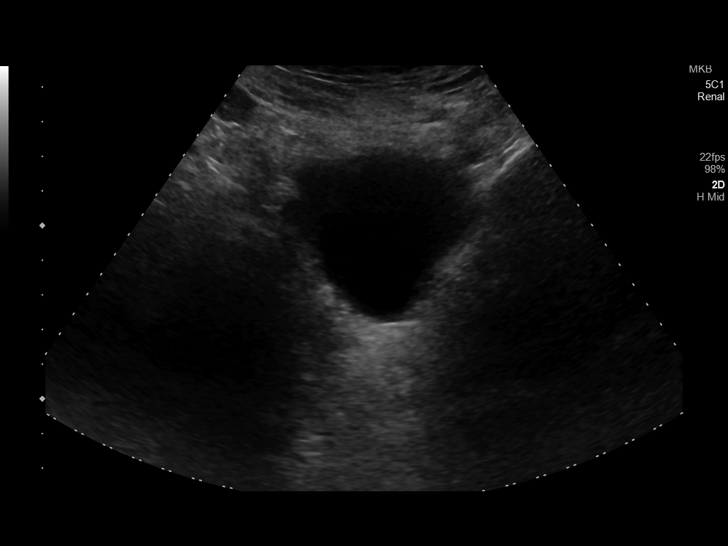
[im 56/61]
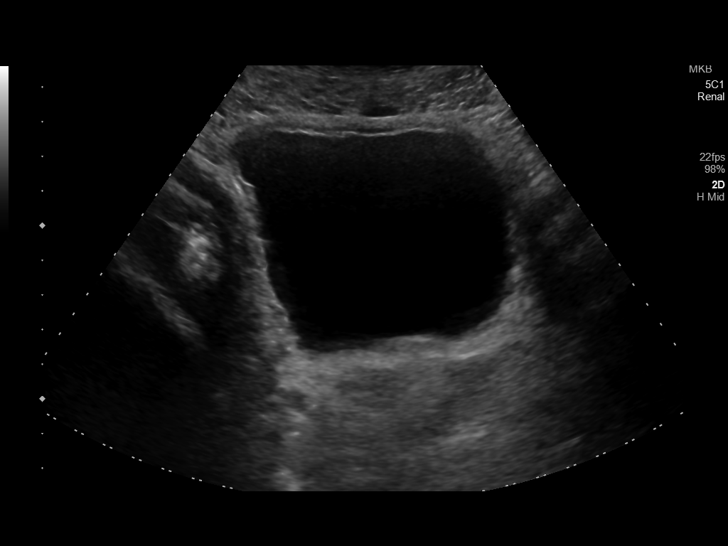
[im 61/61]
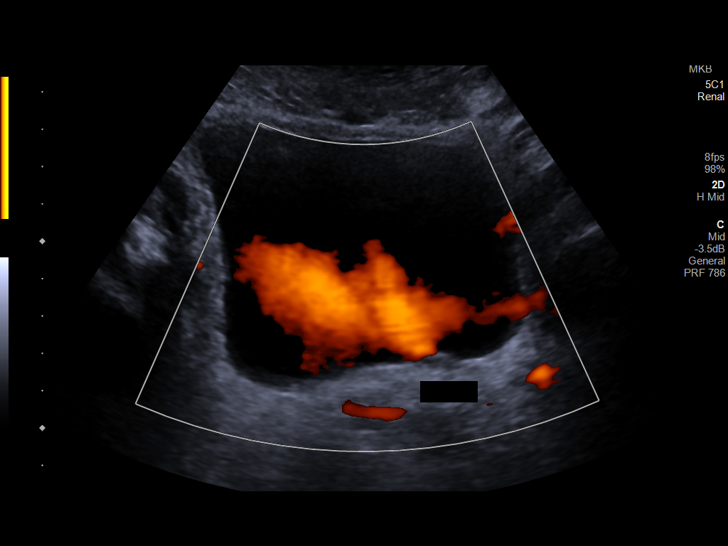

[14 of 25 positions shown; findings below may reference images not displayed]

FINDINGS: Right Kidney:

Renal measurements: 11.5 x 4.5 x 4.8 cm = volume: 130 mL. Normal
echogenicity. No hydronephrosis or shadowing stone. Several cysts
measuring up to 4 cm in the interpolar kidney.

Left Kidney:

Renal measurements: 10.2 x 5.3 x 4.8 cm = volume: 133 mL. No
hydronephrosis or shadowing stone. Multiple cysts measure up to
cm in the upper pole.

Bladder:

Appears normal for degree of bladder distention. Bilateral ureteral
jets noted.

Other:

The prostate gland is enlarged measuring 6.8 x 5.9 x 5.4 cm. There
is median lobe hypertrophy indenting the base of the bladder.
IMPRESSION: 1. Bilateral renal cysts.  No hydronephrosis or shadowing stone.
2. Enlarged prostate gland.

## 2023-10-27 ENCOUNTER — Ambulatory Visit: Payer: Self-pay

## 2023-10-27 DIAGNOSIS — Z09 Encounter for follow-up examination after completed treatment for conditions other than malignant neoplasm: Secondary | ICD-10-CM | POA: Diagnosis present

## 2023-10-27 DIAGNOSIS — Z860101 Personal history of adenomatous and serrated colon polyps: Secondary | ICD-10-CM | POA: Diagnosis not present

## 2023-10-27 DIAGNOSIS — K641 Second degree hemorrhoids: Secondary | ICD-10-CM | POA: Diagnosis not present

## 2023-10-27 DIAGNOSIS — K5730 Diverticulosis of large intestine without perforation or abscess without bleeding: Secondary | ICD-10-CM | POA: Diagnosis not present
